# Patient Record
Sex: Male | Born: 1965 | Race: Black or African American | Hispanic: No | Marital: Single
Health system: Midwestern US, Community
[De-identification: ages and names within clinical notes are randomized; demographics above are authoritative.]

## PROBLEM LIST (undated history)

## (undated) DIAGNOSIS — G8929 Other chronic pain: Secondary | ICD-10-CM

## (undated) DIAGNOSIS — M549 Dorsalgia, unspecified: Secondary | ICD-10-CM

## (undated) DIAGNOSIS — K859 Acute pancreatitis without necrosis or infection, unspecified: Secondary | ICD-10-CM

## (undated) DIAGNOSIS — J45909 Unspecified asthma, uncomplicated: Secondary | ICD-10-CM

## (undated) DIAGNOSIS — I10 Essential (primary) hypertension: Secondary | ICD-10-CM

## (undated) DIAGNOSIS — F17210 Nicotine dependence, cigarettes, uncomplicated: Secondary | ICD-10-CM

## (undated) DIAGNOSIS — R202 Paresthesia of skin: Secondary | ICD-10-CM

## (undated) HISTORY — DX: Essential (primary) hypertension: I10

## (undated) HISTORY — PX: HERNIA REPAIR: SHX51

---

## 2013-10-04 ENCOUNTER — Emergency Department (HOSPITAL_COMMUNITY)
Admission: EM | Admit: 2013-10-04 | Discharge: 2013-10-04 | Disposition: A | Discharge status: Home / Self Care | Payer: No Typology Code available for payment source | Attending: Emergency Medicine | Admitting: Emergency Medicine

## 2013-10-04 ENCOUNTER — Encounter (HOSPITAL_COMMUNITY): Payer: Self-pay | Admitting: Emergency Medicine

## 2013-10-04 DIAGNOSIS — S161XXA Strain of muscle, fascia and tendon at neck level, initial encounter: Secondary | ICD-10-CM

## 2013-10-04 DIAGNOSIS — Y9389 Activity, other specified: Secondary | ICD-10-CM | POA: Insufficient documentation

## 2013-10-04 DIAGNOSIS — Y92410 Unspecified street and highway as the place of occurrence of the external cause: Secondary | ICD-10-CM | POA: Insufficient documentation

## 2013-10-04 DIAGNOSIS — J45909 Unspecified asthma, uncomplicated: Secondary | ICD-10-CM | POA: Insufficient documentation

## 2013-10-04 DIAGNOSIS — S139XXA Sprain of joints and ligaments of unspecified parts of neck, initial encounter: Secondary | ICD-10-CM | POA: Insufficient documentation

## 2013-10-04 DIAGNOSIS — Z79899 Other long term (current) drug therapy: Secondary | ICD-10-CM | POA: Insufficient documentation

## 2013-10-04 DIAGNOSIS — F17200 Nicotine dependence, unspecified, uncomplicated: Secondary | ICD-10-CM | POA: Insufficient documentation

## 2013-10-04 DIAGNOSIS — S0990XA Unspecified injury of head, initial encounter: Secondary | ICD-10-CM | POA: Insufficient documentation

## 2013-10-04 HISTORY — DX: Unspecified asthma, uncomplicated: J45.909

## 2013-10-04 MED ORDER — CYCLOBENZAPRINE HCL 10 MG PO TABS: 10.0000 mg | ORAL_TABLET | Freq: Three times a day (TID) | ORAL | Status: DC | PRN
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
## 2013-11-17 ENCOUNTER — Encounter (HOSPITAL_COMMUNITY): Payer: Self-pay | Admitting: Emergency Medicine

## 2013-11-17 ENCOUNTER — Emergency Department (HOSPITAL_COMMUNITY)
Admission: EM | Admit: 2013-11-17 | Discharge: 2013-11-17 | Disposition: A | Discharge status: Home / Self Care | Payer: Medicaid Other | Attending: Emergency Medicine | Admitting: Emergency Medicine

## 2013-11-17 DIAGNOSIS — J45901 Unspecified asthma with (acute) exacerbation: Secondary | ICD-10-CM | POA: Insufficient documentation

## 2013-11-17 DIAGNOSIS — Z79899 Other long term (current) drug therapy: Secondary | ICD-10-CM | POA: Insufficient documentation

## 2013-11-17 DIAGNOSIS — F17200 Nicotine dependence, unspecified, uncomplicated: Secondary | ICD-10-CM | POA: Insufficient documentation

## 2013-11-17 MED ORDER — IPRATROPIUM BROMIDE 0.02 % IN SOLN
0.5000 mg | Freq: Once | RESPIRATORY_TRACT | Status: AC
  Administered 2013-11-17: 0.5 mg via RESPIRATORY_TRACT
  Filled 2013-11-17: qty 2.5

## 2013-11-17 MED ORDER — ALBUTEROL SULFATE (5 MG/ML) 0.5% IN NEBU
5.0000 mg | INHALATION_SOLUTION | Freq: Once | RESPIRATORY_TRACT | Status: AC
  Administered 2013-11-17: 5 mg via RESPIRATORY_TRACT
  Filled 2013-11-17: qty 1

## 2013-11-17 MED ORDER — PREDNISONE 20 MG PO TABS
60.0000 mg | ORAL_TABLET | Freq: Once | ORAL | Status: AC
  Administered 2013-11-17: 60 mg via ORAL
  Filled 2013-11-17: qty 3

## 2013-11-17 MED ORDER — PREDNISONE 20 MG PO TABS: 40.0000 mg | ORAL_TABLET | Freq: Every day | ORAL | Status: DC

## 2013-11-17 NOTE — ED Notes (Signed)
Reports having asthma, having sob x 1 week, no relief with inhalers. Audible wheezing noted on arrival. Speaking in full sentences, spo2 99%.

## 2013-11-17 NOTE — ED Provider Notes (Signed)
Medical screening examination/treatment/procedure(s) were performed by non-physician practitioner and as supervising physician I was immediately available for consultation/collaboration.  EKG Interpretation   None         Robbye Dede R Darnita Woodrum, MD 11/17/13 1559 

## 2013-11-17 NOTE — ED Provider Notes (Signed)
CSN: [id number]     Arrival date & time 11/17/13  1004 History  This chart was scribed for non-physician practitioner Rhea Bleacher PA-C working with Celene Kras, MD by Leone Payor, ED Scribe. This patient was seen in room TR11C/TR11C and the patient's care was started at 1004.    Chief Complaint  Patient presents with  . Asthma    The history is provided by the patient. No language interpreter was used.    HPI Comments: Ian Patrick is a 47 y.o. male who presents to the Emergency Department complaining of asthma exacerbation that began 1 week ago. Pt states having associated constant, unchanged SOB, wheezing, and coughing. He has been using Symbicort and Combivent without relief. Pt is a daily smoker. He denies fevers, rhinorrhea, sore throat, nausea, vomiting, abdominal pain. The onset of this condition was gradual. The course is constant. Aggravating factors: none. Alleviating factors: none.      Past Medical History  Diagnosis Date  . Asthma    History reviewed. No pertinent past surgical history. History reviewed. No pertinent family history. History  Substance Use Topics  . Smoking status: Current Every Day Smoker  . Smokeless tobacco: Not on file  . Alcohol Use: Yes    Review of Systems  Constitutional: Negative for fever.  HENT: Negative for rhinorrhea and sore throat.   Eyes: Negative for redness.  Respiratory: Positive for cough, chest tightness, shortness of breath and wheezing.   Cardiovascular: Negative for chest pain.  Gastrointestinal: Negative for nausea, vomiting, abdominal pain and diarrhea.  Genitourinary: Negative for dysuria.  Musculoskeletal: Negative for myalgias.  Skin: Negative for rash.  Neurological: Negative for headaches.    Allergies  Review of patient's allergies indicates no known allergies.  Home Medications   Current Outpatient Rx  Name  Route  Sig  Dispense  Refill  . budesonide-formoterol (SYMBICORT) 80-4.5 MCG/ACT inhaler  Inhalation   Inhale 1 puff into the lungs 2 (two) times daily.          . cyclobenzaprine (FLEXERIL) 10 MG tablet   Oral   Take 1 tablet (10 mg total) by mouth 3 (three) times daily as needed for muscle spasms.   20 tablet   0   . hydrochlorothiazide (HYDRODIURIL) 25 MG tablet   Oral   Take 25 mg by mouth daily.         . Ipratropium-Albuterol (COMBIVENT) 20-100 MCG/ACT AERS respimat   Inhalation   Inhale 1 puff into the lungs every 6 (six) hours.          BP 146/86  Pulse 82  Temp(Src) 97.9 F (36.6 C) (Oral)  Resp 20  SpO2 99% Physical Exam  Nursing note and vitals reviewed. Constitutional: He appears well-developed and well-nourished.  HENT:  Head: Normocephalic and atraumatic.  Right Ear: External ear normal.  Left Ear: External ear normal.  Nose: Nose normal.  Mouth/Throat: Oropharynx is clear and moist. No oropharyngeal exudate.  Eyes: Conjunctivae are normal.  Neck: Normal range of motion. Neck supple.  Cardiovascular: Normal rate.   Pulmonary/Chest: Effort normal. No respiratory distress. He has wheezes (mild expiratory wheezes at the bases).  Pt coughing during exam.   Abdominal: He exhibits no distension.  Neurological: He is alert.  Skin: Skin is warm and dry.  Psychiatric: He has a normal mood and affect.    ED Course  Procedures   DIAGNOSTIC STUDIES: Oxygen Saturation is 97% on RA, adequate by my interpretation.    COORDINATION OF CARE: 10:35 AM  Will give breathing treatment. Discussed treatment plan with pt at bedside and pt agreed to plan.  11:22 AM Upon recheck, patient's lungs are now clear. No wheezing noted. Will discharge home with prednisone.    Labs Review Labs Reviewed - No data to display Imaging Review No results found.  EKG Interpretation   None      Vital signs reviewed and are as follows: Filed Vitals:   11/17/13 1100  BP:   Pulse:   Temp:   Resp: 19  BP 146/86  Pulse 82  Temp(Src) 97.9 F (36.6 C) (Oral)   Resp 19  SpO2 99%  Patient urged to return with worsening symptoms or other concerns. Patient verbalized understanding and agrees with plan.   He will use home combivent and symbicort as directed.    MDM   1. Asthma exacerbation    Patient with asthma exacerbation, symptoms improved with neb. Given 7 days of sx, will tx with prednisone. Vitals normal.   I personally performed the services described in this documentation, which was scribed in my presence. The recorded information has been reviewed and is accurate.   Renne Crigler, PA-C 11/17/13 1336

## 2013-11-17 NOTE — ED Notes (Signed)
C/o asthma "acting up" since Monday. States inhalers & albuterol neb not helping. Denies SOB. Resp e/u, no distress

## 2013-11-26 ENCOUNTER — Emergency Department (HOSPITAL_COMMUNITY)
Admission: EM | Admit: 2013-11-26 | Discharge: 2013-11-26 | Disposition: A | Discharge status: Home / Self Care | Payer: Medicaid Other | Attending: Emergency Medicine | Admitting: Emergency Medicine

## 2013-11-26 ENCOUNTER — Encounter (HOSPITAL_COMMUNITY): Payer: Self-pay | Admitting: Emergency Medicine

## 2013-11-26 DIAGNOSIS — R142 Eructation: Secondary | ICD-10-CM | POA: Insufficient documentation

## 2013-11-26 DIAGNOSIS — G8929 Other chronic pain: Secondary | ICD-10-CM | POA: Insufficient documentation

## 2013-11-26 DIAGNOSIS — F1010 Alcohol abuse, uncomplicated: Secondary | ICD-10-CM | POA: Insufficient documentation

## 2013-11-26 DIAGNOSIS — K852 Alcohol induced acute pancreatitis without necrosis or infection: Secondary | ICD-10-CM

## 2013-11-26 DIAGNOSIS — IMO0002 Reserved for concepts with insufficient information to code with codable children: Secondary | ICD-10-CM | POA: Insufficient documentation

## 2013-11-26 DIAGNOSIS — R110 Nausea: Secondary | ICD-10-CM | POA: Insufficient documentation

## 2013-11-26 DIAGNOSIS — Z79899 Other long term (current) drug therapy: Secondary | ICD-10-CM | POA: Insufficient documentation

## 2013-11-26 DIAGNOSIS — J45909 Unspecified asthma, uncomplicated: Secondary | ICD-10-CM | POA: Insufficient documentation

## 2013-11-26 DIAGNOSIS — R141 Gas pain: Secondary | ICD-10-CM | POA: Insufficient documentation

## 2013-11-26 DIAGNOSIS — F1410 Cocaine abuse, uncomplicated: Secondary | ICD-10-CM | POA: Insufficient documentation

## 2013-11-26 DIAGNOSIS — M549 Dorsalgia, unspecified: Secondary | ICD-10-CM | POA: Insufficient documentation

## 2013-11-26 DIAGNOSIS — F17200 Nicotine dependence, unspecified, uncomplicated: Secondary | ICD-10-CM | POA: Insufficient documentation

## 2013-11-26 DIAGNOSIS — K859 Acute pancreatitis without necrosis or infection, unspecified: Secondary | ICD-10-CM | POA: Insufficient documentation

## 2013-11-26 HISTORY — DX: Dorsalgia, unspecified: M54.9

## 2013-11-26 HISTORY — DX: Other chronic pain: G89.29

## 2013-11-26 HISTORY — DX: Acute pancreatitis without necrosis or infection, unspecified: K85.90

## 2013-11-26 LAB — URINALYSIS, ROUTINE W REFLEX MICROSCOPIC
Glucose, UA: NEGATIVE mg/dL
Hgb urine dipstick: NEGATIVE
Ketones, ur: 15 mg/dL — AB
Leukocytes, UA: NEGATIVE
Protein, ur: NEGATIVE mg/dL
Urobilinogen, UA: 0.2 mg/dL (ref 0.0–1.0)
pH: 7 (ref 5.0–8.0)

## 2013-11-26 LAB — CBC WITH DIFFERENTIAL/PLATELET
Basophils Absolute: 0 10*3/uL (ref 0.0–0.1)
Basophils Relative: 0 % (ref 0–1)
Eosinophils Absolute: 0.1 10*3/uL (ref 0.0–0.7)
Eosinophils Relative: 1 % (ref 0–5)
HCT: 44.9 % (ref 39.0–52.0)
Hemoglobin: 15.7 g/dL (ref 13.0–17.0)
Lymphocytes Relative: 29 % (ref 12–46)
Lymphs Abs: 1.7 10*3/uL (ref 0.7–4.0)
MCHC: 35 g/dL (ref 30.0–36.0)
MCV: 97.8 fL (ref 78.0–100.0)
Monocytes Absolute: 0.7 10*3/uL (ref 0.1–1.0)
Monocytes Relative: 12 % (ref 3–12)
Neutro Abs: 3.4 10*3/uL (ref 1.7–7.7)
RBC: 4.59 MIL/uL (ref 4.22–5.81)
WBC: 5.9 10*3/uL (ref 4.0–10.5)

## 2013-11-26 LAB — COMPREHENSIVE METABOLIC PANEL
ALT: 18 U/L (ref 0–53)
AST: 25 U/L (ref 0–37)
CO2: 26 mEq/L (ref 19–32)
Calcium: 9.3 mg/dL (ref 8.4–10.5)
Chloride: 96 mEq/L (ref 96–112)
Creatinine, Ser: 1.29 mg/dL (ref 0.50–1.35)
GFR calc Af Amer: 75 mL/min — ABNORMAL LOW (ref >90)
GFR calc non Af Amer: 65 mL/min — ABNORMAL LOW (ref >90)
Glucose, Bld: 111 mg/dL — ABNORMAL HIGH (ref 70–99)
Sodium: 135 mEq/L (ref 135–145)
Total Bilirubin: 0.6 mg/dL (ref 0.3–1.2)
Total Protein: 7.8 g/dL (ref 6.0–8.3)

## 2013-11-26 MED ORDER — HYDROCODONE-ACETAMINOPHEN 5-325 MG PO TABS: 1.0000 | ORAL_TABLET | Freq: Four times a day (QID) | ORAL | Status: DC | PRN

## 2013-11-26 MED ORDER — SODIUM CHLORIDE 0.9 % IV SOLN
Freq: Once | INTRAVENOUS | Status: AC
  Administered 2013-11-26: 14:00:00 via INTRAVENOUS

## 2013-11-26 MED ORDER — MORPHINE SULFATE 4 MG/ML IJ SOLN
4.0000 mg | Freq: Once | INTRAMUSCULAR | Status: AC
  Administered 2013-11-26: 4 mg via INTRAVENOUS
  Filled 2013-11-26: qty 1

## 2013-11-26 MED ORDER — ONDANSETRON HCL 4 MG/2ML IJ SOLN
4.0000 mg | Freq: Once | INTRAMUSCULAR | Status: AC
  Administered 2013-11-26: 4 mg via INTRAVENOUS
  Filled 2013-11-26: qty 2

## 2013-11-26 MED ORDER — ONDANSETRON 4 MG PO TBDP: 4.0000 mg | ORAL_TABLET | Freq: Three times a day (TID) | ORAL | Status: DC | PRN

## 2013-11-26 MED ORDER — HYDROCHLOROTHIAZIDE 25 MG PO TABS
25.0000 mg | ORAL_TABLET | Freq: Every day | ORAL | Status: DC
  Administered 2013-11-26: 25 mg via ORAL
  Filled 2013-11-26: qty 1

## 2013-11-26 MED ORDER — SODIUM CHLORIDE 0.9 % IV BOLUS (SEPSIS)
1000.0000 mL | Freq: Once | INTRAVENOUS | Status: AC
  Administered 2013-11-26: 1000 mL via INTRAVENOUS

## 2013-11-26 MED ORDER — MORPHINE SULFATE 2 MG/ML IJ SOLN
2.0000 mg | Freq: Once | INTRAMUSCULAR | Status: AC
  Administered 2013-11-26: 2 mg via INTRAVENOUS
  Filled 2013-11-26: qty 1

## 2013-11-26 NOTE — ED Provider Notes (Signed)
  Medical screening examination/treatment/procedure(s) were performed by non-physician practitioner and as supervising physician I was immediately available for consultation/collaboration.  EKG Interpretation    Date/Time:  Saturday November 26 2013 11:13:14 EST Ventricular Rate:  77 PR Interval:  116 QRS Duration: 98 QT Interval:  390 QTC Calculation: 441 R Axis:   87 Text Interpretation:  Normal sinus rhythm Normal ECG Sinus rhythm Normal ECG Confirmed by Gerhard Munch  MD ([id number]) on 11/26/2013 12:11:46 PM               Gerhard Munch, MD 11/26/13 1537

## 2013-11-26 NOTE — ED Notes (Signed)
Pt is here with LUQ abdominal pain with history of pancreatitis.  Pt reports ETOH daily.  Nauseated

## 2013-11-26 NOTE — ED Notes (Signed)
PA at bedside.

## 2013-11-26 NOTE — ED Provider Notes (Signed)
CSN: [id number]     Arrival date & time 11/26/13  1109 History   First MD Initiated Contact with Patient 11/26/13 1118     Chief Complaint  Patient presents with  . Abdominal Pain  . Nausea   (Consider location/radiation/quality/duration/timing/severity/associated sxs/prior Treatment) Patient is a 47 y.o. male presenting with abdominal pain.  Abdominal Pain Associated symptoms: nausea   Associated symptoms: no chest pain, no chills, no diarrhea, no dysuria, no fever, no hematuria, no shortness of breath and no vomiting    47 yo male with PMH significant for Pancreatitis presents with LUQ pain that started yesterday around 6 or 7 pm. Patient is a daily drinker about 5-7 drinks per day x 2 years. Drink of choice with orange juice and vodka. Patient started drinking yesterday morning and continued drinking throughout the day. Pain came on gradually and remains constant at a 7/10. Pain does not radiate. Admits to nausea that started this morning and comes in waves. Denies Vomiting, diarrhea, constipation. Denies Chest pain and shortness of breath. Denies Hematochezia, Melena, and hemoptysis.  Patient admits to cocaine use yesterday at approximately 1 pm.    Past Medical History  Diagnosis Date  . Asthma   . Pancreatitis   . Back pain, chronic    Past Surgical History  Procedure Laterality Date  . Hernia repair     No family history on file. History  Substance Use Topics  . Smoking status: Current Every Day Smoker  . Smokeless tobacco: Not on file  . Alcohol Use: Yes     Comment: daily    Review of Systems  Constitutional: Negative for fever and chills.  Respiratory: Negative for chest tightness and shortness of breath.   Cardiovascular: Negative for chest pain.  Gastrointestinal: Positive for nausea and abdominal pain. Negative for vomiting, diarrhea and blood in stool.  Genitourinary: Negative for dysuria, hematuria and difficulty urinating.  Neurological: Negative for  headaches.  All other systems reviewed and are negative.    Allergies  Review of patient's allergies indicates no known allergies.  Home Medications   Current Outpatient Rx  Name  Route  Sig  Dispense  Refill  . budesonide-formoterol (SYMBICORT) 80-4.5 MCG/ACT inhaler   Inhalation   Inhale 1 puff into the lungs 2 (two) times daily.          . hydrochlorothiazide (HYDRODIURIL) 25 MG tablet   Oral   Take 25 mg by mouth daily.         . Ipratropium-Albuterol (COMBIVENT) 20-100 MCG/ACT AERS respimat   Inhalation   Inhale 1 puff into the lungs every 6 (six) hours.         . predniSONE (DELTASONE) 20 MG tablet   Oral   Take 2 tablets (40 mg total) by mouth daily.   8 tablet   0   . HYDROcodone-acetaminophen (NORCO) 5-325 MG per tablet   Oral   Take 1-2 tablets by mouth every 6 (six) hours as needed.   15 tablet   0   . ondansetron (ZOFRAN ODT) 4 MG disintegrating tablet   Oral   Take 1 tablet (4 mg total) by mouth every 8 (eight) hours as needed for nausea or vomiting. 4mg  ODT q4 hours prn nausea/vomit   10 tablet   0    BP 132/71  Pulse 56  Temp(Src) 97.8 F (36.6 C) (Oral)  Resp 20  Wt 234 lb (106.142 kg)  SpO2 97% Physical Exam  Vitals reviewed. Constitutional: He is oriented to person, place,  and time. He appears well-developed and well-nourished. He appears distressed.  HENT:  Head: Normocephalic and atraumatic.  Eyes: Conjunctivae and EOM are normal. Pupils are equal, round, and reactive to light. No scleral icterus.  Cardiovascular: Normal rate, regular rhythm and normal heart sounds.  Exam reveals no gallop and no friction rub.   No murmur heard. Pulmonary/Chest: Effort normal and breath sounds normal. No respiratory distress. He has no wheezes. He has no rales.  Abdominal: Soft. He exhibits distension. Bowel sounds are decreased. There is tenderness in the epigastric area and left upper quadrant. There is no rigidity, no rebound, no guarding, no CVA  tenderness and no tenderness at McBurney's point. No hernia.  Musculoskeletal: Normal range of motion.  Neurological: He is alert and oriented to person, place, and time.  Skin: Skin is warm and dry. He is not diaphoretic. No cyanosis.  No jaundice or color change appreciated.   Psychiatric: He has a normal mood and affect. His behavior is normal.    ED Course  Procedures (including critical care time) Labs Review Labs Reviewed  CBC WITH DIFFERENTIAL - Abnormal; Notable for the following:    MCH 34.2 (*)    All other components within normal limits  COMPREHENSIVE METABOLIC PANEL - Abnormal; Notable for the following:    Glucose, Bld 111 (*)    GFR calc non Af Amer 65 (*)    GFR calc Af Amer 75 (*)    All other components within normal limits  LIPASE, BLOOD - Abnormal; Notable for the following:    Lipase 109 (*)    All other components within normal limits  URINALYSIS, ROUTINE W REFLEX MICROSCOPIC - Abnormal; Notable for the following:    Ketones, ur 15 (*)    All other components within normal limits  POCT I-STAT TROPONIN I   Imaging Review No results found.  EKG Interpretation    Date/Time:  Saturday November 26 2013 11:13:14 EST Ventricular Rate:  77 PR Interval:  116 QRS Duration: 98 QT Interval:  390 QTC Calculation: 441 R Axis:   87 Text Interpretation:  Normal sinus rhythm Normal ECG Sinus rhythm Normal ECG Confirmed by Gerhard Munch  MD ([id number]) on 11/26/2013 12:11:46 PM            MDM   1. Pancreatitis, alcoholic    Patient presents with epigastric pain following a full day of drinking yesterday in addition to cocaine use. PMH significant for Pancreatitis and HTN. Denies any other cardiac hx. Patient has not taken his HCTZ today. No signs of jaundice.   Patient comfortable after pain control with no nausea. Lipase elevated. Troponin Neg. EKG normal. Patient tolerates PO challenge. Plan to discharge patient home with rx for pain medication and  antiemetics. Discussed alcohol/drug cessation. Educated patient on clear liquid diet with gradual advancement and given educational handout. Provided resources for followup since patient has no PCP. Patient agrees with plan. Advised to return to ED if symptoms worsen.   Patient riding home with fionce.     Rudene Anda, PA-C 11/26/13 1521

## 2013-11-27 ENCOUNTER — Inpatient Hospital Stay (HOSPITAL_COMMUNITY)
Admission: EM | Admit: 2013-11-27 | Discharge: 2013-11-30 | DRG: 440 | Disposition: A | Discharge status: Home / Self Care | Payer: Medicaid Other | Attending: Internal Medicine | Admitting: Internal Medicine

## 2013-11-27 ENCOUNTER — Emergency Department (HOSPITAL_COMMUNITY): Payer: Medicaid Other

## 2013-11-27 ENCOUNTER — Encounter (HOSPITAL_COMMUNITY): Payer: Self-pay | Admitting: Emergency Medicine

## 2013-11-27 DIAGNOSIS — F1910 Other psychoactive substance abuse, uncomplicated: Secondary | ICD-10-CM

## 2013-11-27 DIAGNOSIS — F1410 Cocaine abuse, uncomplicated: Secondary | ICD-10-CM | POA: Diagnosis present

## 2013-11-27 DIAGNOSIS — F1010 Alcohol abuse, uncomplicated: Secondary | ICD-10-CM | POA: Diagnosis present

## 2013-11-27 DIAGNOSIS — K861 Other chronic pancreatitis: Secondary | ICD-10-CM | POA: Diagnosis present

## 2013-11-27 DIAGNOSIS — K859 Acute pancreatitis without necrosis or infection, unspecified: Principal | ICD-10-CM | POA: Diagnosis present

## 2013-11-27 DIAGNOSIS — J45909 Unspecified asthma, uncomplicated: Secondary | ICD-10-CM | POA: Diagnosis present

## 2013-11-27 DIAGNOSIS — I10 Essential (primary) hypertension: Secondary | ICD-10-CM | POA: Diagnosis present

## 2013-11-27 DIAGNOSIS — J4520 Mild intermittent asthma, uncomplicated: Secondary | ICD-10-CM

## 2013-11-27 DIAGNOSIS — F17200 Nicotine dependence, unspecified, uncomplicated: Secondary | ICD-10-CM | POA: Diagnosis present

## 2013-11-27 LAB — URINALYSIS, ROUTINE W REFLEX MICROSCOPIC
Bilirubin Urine: NEGATIVE
Hgb urine dipstick: NEGATIVE
Leukocytes, UA: NEGATIVE
Protein, ur: NEGATIVE mg/dL
Specific Gravity, Urine: 1.023 (ref 1.005–1.030)
Urobilinogen, UA: 0.2 mg/dL (ref 0.0–1.0)
pH: 5 (ref 5.0–8.0)

## 2013-11-27 LAB — POCT I-STAT TROPONIN I: Troponin i, poc: 0 ng/mL (ref 0.00–0.08)

## 2013-11-27 LAB — CBC WITH DIFFERENTIAL/PLATELET
Eosinophils Relative: 0 % (ref 0–5)
HCT: 46 % (ref 39.0–52.0)
Hemoglobin: 16.5 g/dL (ref 13.0–17.0)
Lymphocytes Relative: 7 % — ABNORMAL LOW (ref 12–46)
Lymphs Abs: 0.8 10*3/uL (ref 0.7–4.0)
MCV: 95.8 fL (ref 78.0–100.0)
Monocytes Absolute: 1 10*3/uL (ref 0.1–1.0)
Monocytes Relative: 9 % (ref 3–12)
Neutro Abs: 9.6 10*3/uL — ABNORMAL HIGH (ref 1.7–7.7)
RBC: 4.8 MIL/uL (ref 4.22–5.81)
RDW: 13.2 % (ref 11.5–15.5)
WBC: 11.4 10*3/uL — ABNORMAL HIGH (ref 4.0–10.5)

## 2013-11-27 LAB — MAGNESIUM: Magnesium: 1.9 mg/dL (ref 1.5–2.5)

## 2013-11-27 LAB — COMPREHENSIVE METABOLIC PANEL
ALT: 15 U/L (ref 0–53)
AST: 19 U/L (ref 0–37)
Albumin: 4.2 g/dL (ref 3.5–5.2)
Alkaline Phosphatase: 86 U/L (ref 39–117)
Calcium: 9.3 mg/dL (ref 8.4–10.5)
GFR calc non Af Amer: 77 mL/min — ABNORMAL LOW (ref >90)
Glucose, Bld: 135 mg/dL — ABNORMAL HIGH (ref 70–99)
Potassium: 3.6 mEq/L (ref 3.5–5.1)
Total Protein: 8.1 g/dL (ref 6.0–8.3)

## 2013-11-27 LAB — RAPID URINE DRUG SCREEN, HOSP PERFORMED
Amphetamines: NOT DETECTED
Benzodiazepines: NOT DETECTED
Opiates: POSITIVE — AB

## 2013-11-27 LAB — PHOSPHORUS: Phosphorus: 2.5 mg/dL (ref 2.3–4.6)

## 2013-11-27 LAB — GLUCOSE, CAPILLARY: Glucose-Capillary: 102 mg/dL — ABNORMAL HIGH (ref 70–99)

## 2013-11-27 MED ORDER — ONDANSETRON 4 MG PO TBDP
4.0000 mg | ORAL_TABLET | Freq: Three times a day (TID) | ORAL | Status: DC | PRN
  Filled 2013-11-27: qty 1

## 2013-11-27 MED ORDER — IOHEXOL 300 MG/ML  SOLN
25.0000 mL | INTRAMUSCULAR | Status: DC | PRN
  Administered 2013-11-27: 25 mL via ORAL

## 2013-11-27 MED ORDER — HYDROCHLOROTHIAZIDE 25 MG PO TABS
25.0000 mg | ORAL_TABLET | Freq: Every day | ORAL | Status: DC
  Administered 2013-11-27 – 2013-11-30 (×4): 25 mg via ORAL
  Filled 2013-11-27 (×4): qty 1

## 2013-11-27 MED ORDER — SODIUM CHLORIDE 0.9 % IV BOLUS (SEPSIS)
1000.0000 mL | INTRAVENOUS | Status: AC
  Administered 2013-11-27: 1000 mL via INTRAVENOUS

## 2013-11-27 MED ORDER — HYDROMORPHONE HCL PF 1 MG/ML IJ SOLN
1.0000 mg | Freq: Once | INTRAMUSCULAR | Status: AC
  Administered 2013-11-27: 1 mg via INTRAVENOUS
  Filled 2013-11-27: qty 1

## 2013-11-27 MED ORDER — ONDANSETRON HCL 4 MG/2ML IJ SOLN
4.0000 mg | Freq: Once | INTRAMUSCULAR | Status: AC
  Administered 2013-11-27: 4 mg via INTRAVENOUS
  Filled 2013-11-27: qty 2

## 2013-11-27 MED ORDER — HYDROMORPHONE HCL PF 1 MG/ML IJ SOLN
1.0000 mg | INTRAMUSCULAR | Status: DC | PRN
  Administered 2013-11-27 – 2013-11-29 (×18): 1 mg via INTRAVENOUS
  Filled 2013-11-27 (×18): qty 1

## 2013-11-27 MED ORDER — SODIUM CHLORIDE 0.9 % IV SOLN
INTRAVENOUS | Status: DC
  Administered 2013-11-27 – 2013-11-30 (×5): via INTRAVENOUS

## 2013-11-27 MED ORDER — ONDANSETRON 4 MG PO TBDP
4.0000 mg | ORAL_TABLET | Freq: Four times a day (QID) | ORAL | Status: DC | PRN
  Administered 2013-11-27 (×2): 4 mg via ORAL
  Filled 2013-11-27 (×2): qty 1

## 2013-11-27 MED ORDER — BUDESONIDE-FORMOTEROL FUMARATE 80-4.5 MCG/ACT IN AERO
1.0000 | INHALATION_SPRAY | Freq: Two times a day (BID) | RESPIRATORY_TRACT | Status: DC
  Administered 2013-11-27 – 2013-11-30 (×6): 1 via RESPIRATORY_TRACT
  Filled 2013-11-27: qty 6.9

## 2013-11-27 MED ORDER — ENOXAPARIN SODIUM 40 MG/0.4ML ~~LOC~~ SOLN
40.0000 mg | SUBCUTANEOUS | Status: DC
  Administered 2013-11-27 – 2013-11-30 (×4): 40 mg via SUBCUTANEOUS
  Filled 2013-11-27 (×4): qty 0.4

## 2013-11-27 MED ORDER — IPRATROPIUM-ALBUTEROL 20-100 MCG/ACT IN AERS
1.0000 | INHALATION_SPRAY | Freq: Four times a day (QID) | RESPIRATORY_TRACT | Status: DC
  Administered 2013-11-27 (×2): 1 via RESPIRATORY_TRACT
  Filled 2013-11-27: qty 4

## 2013-11-27 MED ORDER — IOHEXOL 300 MG/ML  SOLN
100.0000 mL | Freq: Once | INTRAMUSCULAR | Status: AC | PRN
  Administered 2013-11-27: 100 mL via INTRAVENOUS

## 2013-11-27 MED ORDER — NICOTINE 21 MG/24HR TD PT24
21.0000 mg | MEDICATED_PATCH | Freq: Every day | TRANSDERMAL | Status: DC
  Administered 2013-11-27 – 2013-11-30 (×4): 21 mg via TRANSDERMAL
  Filled 2013-11-27 (×4): qty 1

## 2013-11-27 NOTE — ED Notes (Signed)
Pt finished drinking PO contrast. CT made aware.  

## 2013-11-27 NOTE — ED Notes (Signed)
Dr. Romeo Apple back at the bedside.

## 2013-11-27 NOTE — ED Notes (Signed)
Dr. Romeo Apple at the bedside. Pt states he didn't feel like his pain was relieved when he left here yesterday.

## 2013-11-27 NOTE — ED Provider Notes (Signed)
CSN: [id number]     Arrival date & time 11/27/13  4098 History   First MD Initiated Contact with Patient 11/27/13 0421     Chief Complaint  Patient presents with  . Abdominal Pain   (Consider location/radiation/quality/duration/timing/severity/associated sxs/prior Treatment) Patient is a 47 y.o. male presenting with abdominal pain. The history is provided by the patient.  Abdominal Pain Pain location:  Epigastric Pain quality: sharp   Pain radiation: back. Pain severity:  Moderate Onset quality:  Sudden Duration:  2 days Timing:  Constant Progression:  Worsening Chronicity:  Recurrent Context: alcohol use   Relieved by:  Nothing Worsened by:  Nothing tried Ineffective treatments:  None tried Associated symptoms: no chest pain, no cough, no diarrhea, no dysuria, no fever, no hematuria, no nausea, no shortness of breath and no vomiting     Past Medical History  Diagnosis Date  . Asthma   . Pancreatitis   . Back pain, chronic    Past Surgical History  Procedure Laterality Date  . Hernia repair     No family history on file. History  Substance Use Topics  . Smoking status: Current Every Day Smoker  . Smokeless tobacco: Not on file  . Alcohol Use: Yes     Comment: daily    Review of Systems  Constitutional: Negative for fever.  HENT: Negative for drooling and rhinorrhea.   Eyes: Negative for pain.  Respiratory: Negative for cough and shortness of breath.   Cardiovascular: Negative for chest pain and leg swelling.  Gastrointestinal: Positive for abdominal pain. Negative for nausea, vomiting and diarrhea.  Genitourinary: Negative for dysuria and hematuria.  Musculoskeletal: Negative for gait problem and neck pain.  Skin: Negative for color change.  Neurological: Negative for numbness and headaches.  Hematological: Negative for adenopathy.  Psychiatric/Behavioral: Negative for behavioral problems.  All other systems reviewed and are negative.    Allergies   Review of patient's allergies indicates no known allergies.  Home Medications   Current Outpatient Rx  Name  Route  Sig  Dispense  Refill  . budesonide-formoterol (SYMBICORT) 80-4.5 MCG/ACT inhaler   Inhalation   Inhale 1 puff into the lungs 2 (two) times daily.          . hydrochlorothiazide (HYDRODIURIL) 25 MG tablet   Oral   Take 25 mg by mouth daily.         Marland Kitchen HYDROcodone-acetaminophen (NORCO) 5-325 MG per tablet   Oral   Take 1-2 tablets by mouth every 6 (six) hours as needed.   15 tablet   0   . Ipratropium-Albuterol (COMBIVENT) 20-100 MCG/ACT AERS respimat   Inhalation   Inhale 1 puff into the lungs every 6 (six) hours.         . ondansetron (ZOFRAN ODT) 4 MG disintegrating tablet   Oral   Take 1 tablet (4 mg total) by mouth every 8 (eight) hours as needed for nausea or vomiting. 4mg  ODT q4 hours prn nausea/vomit   10 tablet   0    BP 141/74  Pulse 66  Temp(Src) 97.9 F (36.6 C) (Oral)  Resp 22  SpO2 100% Physical Exam  Nursing note and vitals reviewed. Constitutional: He is oriented to person, place, and time. He appears well-developed and well-nourished.  HENT:  Head: Normocephalic and atraumatic.  Right Ear: External ear normal.  Left Ear: External ear normal.  Nose: Nose normal.  Mouth/Throat: Oropharynx is clear and moist. No oropharyngeal exudate.  Eyes: Conjunctivae and EOM are normal. Pupils are equal,  round, and reactive to light.  Neck: Normal range of motion. Neck supple.  Cardiovascular: Normal rate, regular rhythm, normal heart sounds and intact distal pulses.  Exam reveals no gallop and no friction rub.   No murmur heard. Pulmonary/Chest: Effort normal and breath sounds normal. No respiratory distress. He has no wheezes.  Abdominal: Soft. Bowel sounds are normal. He exhibits no distension. There is tenderness (moderate ttp of epig area). There is no rebound and no guarding.  Musculoskeletal: Normal range of motion. He exhibits no edema  and no tenderness.  Neurological: He is alert and oriented to person, place, and time.  Skin: Skin is warm and dry.  Psychiatric: He has a normal mood and affect. His behavior is normal.    ED Course  Procedures (including critical care time) Labs Review Labs Reviewed  CBC WITH DIFFERENTIAL - Abnormal; Notable for the following:    WBC 11.4 (*)    MCH 34.4 (*)    Neutrophils Relative % 84 (*)    Neutro Abs 9.6 (*)    Lymphocytes Relative 7 (*)    All other components within normal limits  COMPREHENSIVE METABOLIC PANEL - Abnormal; Notable for the following:    Sodium 129 (*)    Chloride 90 (*)    Glucose, Bld 135 (*)    GFR calc non Af Amer 77 (*)    GFR calc Af Amer 89 (*)    All other components within normal limits  LIPASE, BLOOD - Abnormal; Notable for the following:    Lipase 1196 (*)    All other components within normal limits  URINALYSIS, ROUTINE W REFLEX MICROSCOPIC - Abnormal; Notable for the following:    APPearance CLOUDY (*)    Ketones, ur 40 (*)    All other components within normal limits  URINE RAPID DRUG SCREEN (HOSP PERFORMED) - Abnormal; Notable for the following:    Opiates POSITIVE (*)    Cocaine POSITIVE (*)    All other components within normal limits  GLUCOSE, CAPILLARY - Abnormal; Notable for the following:    Glucose-Capillary 118 (*)    All other components within normal limits  GLUCOSE, CAPILLARY - Abnormal; Notable for the following:    Glucose-Capillary 102 (*)    All other components within normal limits  GLUCOSE, CAPILLARY - Abnormal; Notable for the following:    Glucose-Capillary 113 (*)    All other components within normal limits  GLUCOSE, CAPILLARY - Abnormal; Notable for the following:    Glucose-Capillary 150 (*)    All other components within normal limits  GLUCOSE, CAPILLARY - Abnormal; Notable for the following:    Glucose-Capillary 143 (*)    All other components within normal limits  MAGNESIUM  PHOSPHORUS  COMPREHENSIVE  METABOLIC PANEL  CBC  POCT I-STAT TROPONIN I   Imaging Review Ct Abdomen Pelvis W Contrast  11/27/2013   CLINICAL DATA:  Persistent mid and upper abdominal pain with nausea and vomiting since yesterday. Elevated lipase. Patient was here yesterday and was diagnosed with alcoholic pancreatitis.  EXAM: CT ABDOMEN AND PELVIS WITH CONTRAST  TECHNIQUE: Multidetector CT imaging of the abdomen and pelvis was performed using the standard protocol following bolus administration of intravenous contrast.  CONTRAST:  OMNIPAQUE IOHEXOL 300 MG/ML  SOLN  COMPARISON:  None.  FINDINGS: The lung bases are clear.  There is diffuse infiltration around the pancreas and in the upper epigastric region with peripancreatic edema. Edema extends into the left anterior pericolic gutter and left anterior pararenal space.  Focal extension to the posterior spleen. There is normal enhancement of the pancreatic tissue suggesting no evidence of necrosis. Pancreatic duct is not dilated. No loculated fluid collections to suggest pseudocyst.  The liver, spleen, gallbladder, adrenal glands, abdominal aorta, inferior vena cava, and retroperitoneal lymph nodes are unremarkable. Small parenchymal cysts in the kidneys without evidence of solid mass or hydronephrosis. The stomach and small bowel are not abnormally distended. The colon is decompressed but appears to demonstrate diffuse colonic wall thickening. This could be due to under distention but suggest possible colitis. No free air or free fluid in the abdomen.  Pelvis: The appendix is normal. Prostate gland is not enlarged. Bladder wall is not thickened. No free or loculated pelvic fluid collections. No evidence of diverticulitis. The normal alignment of the lumbar spine. No destructive bone lesions.  IMPRESSION: Diffuse peripancreatic edema and infiltration consistent with acute pancreatitis. No evidence of pancreatic necrosis, abscess, or pseudocyst. Nonspecific appearance of the colon  suggesting diffuse wall thickening. This may be due to under distention but colitis is not excluded.   Electronically Signed   By: Burman Nieves M.D.   On: 11/27/2013 06:49    EKG Interpretation    Date/Time:  Sunday November 27 2013 03:50:07 EST Ventricular Rate:  69 PR Interval:  141 QRS Duration: 100 QT Interval:  415 QTC Calculation: 445 R Axis:   85 Text Interpretation:  Sinus rhythm No significant change since last tracing Confirmed by Yang Rack  MD, Louvina Cleary ([id number]) on 11/27/2013 5:51:14 AM            MDM   1. Pancreatitis   2. Acute pancreatitis   3. Asthma, chronic   4. Excessive drinking alcohol   5. Hypertension    4:47 AM 47 y.o. male with a history of alcohol abuse who presents with epigastric pain and vomiting. The patient was seen here yesterday for similar symptoms. He had a mildly elevated lipase at that time. He is afebrile and vital signs are unremarkable here today. He suspected to have alcoholic pancreatitis as he has been drinking 5-6 mixed drinks daily. He states that he did not drink alcohol yesterday but continued to have pain and vomiting. Will get screening labwork, IV fluid, pain control, and CT scan.  Lipase has inc from 109 to 1196. CT c/w acute pancreatitis. Will admit to unassigned (internal medicine teaching service) for pain control.     Junius Argyle, MD 11/28/13 [phone]

## 2013-11-27 NOTE — ED Notes (Signed)
Pt returned from CT °

## 2013-11-27 NOTE — ED Notes (Signed)
Pt. reports persistent mid /upper abdominal pain with nausea and vomitting onset yersterday , seen here yesterday with the same complaints - blood tests / urinalysis done discharged home diagnosed with alcohiolic pancreatitis.

## 2013-11-27 NOTE — H&P (Signed)
Date: 11/27/2013               Patient Name:  Ian Patrick MRN: [id number]  DOB: [birth year] Age / Sex: 47 y.o., male   PCP: No Pcp Per Patient         Medical Service: Internal Medicine Teaching Service         Attending Physician: Dr. Kem Kays    First Contact: Dr. Mariea Clonts Pager: [phone]  Second Contact: Dr. Zada Girt Pager: [phone]       After Hours (After 5p/  First Contact Pager: [phone]  weekends / holidays): Second Contact Pager: [phone]   Chief Complaint: abdominal pain  History of Present Illness:  Mr. Ian Patrick is a 47 year old man with history of chronic pancreatitis, alcohol and cocaine abuse, HTN, asthma who presents with epigastric abdominal pain x 2 days.  Patient was seen in ED yesterday with same complaint, returned today due to intractable pain.   Patient states that Friday (11/28), he drank several ("3 or more") vodka-orange juice drinks and soon after began having epigastric abdominal pain.  Yesterday in ED, he states he was given Percocet which eased the pain enough so he could sleep, but pain returned this morning and worse.  He has also had chills and felt somewhat nauseous, vomited once (clear, NBNB) this morning.  He has reportedly had two previous episodes of pancreatitis (last one "several years ago" and required 3 day hospitalization), both following bouts of drinking.   Denies fever, chest pain, change in bowel or bladder habits. Last BM yesterday, no blood.  He smokes 1/2 PPD (x 20 years), drinks 1-2 mixed drinks daily, denies illicits.  He moved to [place] 2 months ago from [place] and lives here with his Ian Patrick, works as a Psychologist, occupational.   In ED, patient had CT abdomen which showed diffuse peripancreatic edema and infiltration consistent with acute pancreatitis; no evidence of pancreatic necrosis, abscess, or psuedocyst.  His lipase is also elevated to 1196 from 109 on 11/29 (yesterday).  He received pain control, IVFs and was admitted to IMTS.   Meds: Current  Facility-Administered Medications  Medication Dose Route Frequency Provider Last Rate Last Dose  . iohexol (OMNIPAQUE) 300 MG/ML solution 25 mL  25 mL Oral PRN Medication Radiologist, MD   25 mL at 11/27/13 0511   Current Outpatient Prescriptions  Medication Sig Dispense Refill  . budesonide-formoterol (SYMBICORT) 80-4.5 MCG/ACT inhaler Inhale 1 puff into the lungs 2 (two) times daily.       . hydrochlorothiazide (HYDRODIURIL) 25 MG tablet Take 25 mg by mouth daily.      Marland Kitchen HYDROcodone-acetaminophen (NORCO) 5-325 MG per tablet Take 1-2 tablets by mouth every 6 (six) hours as needed.  15 tablet  0  . Ipratropium-Albuterol (COMBIVENT) 20-100 MCG/ACT AERS respimat Inhale 1 puff into the lungs every 6 (six) hours.      . ondansetron (ZOFRAN ODT) 4 MG disintegrating tablet Take 1 tablet (4 mg total) by mouth every 8 (eight) hours as needed for nausea or vomiting. 4mg  ODT q4 hours prn nausea/vomit  10 tablet  0    Allergies: Allergies as of 11/27/2013  . (No Known Allergies)   Past Medical History  Diagnosis Date  . Asthma   . Pancreatitis   . Back pain, chronic    Past Surgical History  Procedure Laterality Date  . Hernia repair     No family history on file. History   Social History  . Marital Status: Single  Spouse Name: N/A    Number of Children: N/A  . Years of Education: N/A   Occupational History  . Not on file.   Social History Main Topics  . Smoking status: Current Every Day Smoker  . Smokeless tobacco: Not on file  . Alcohol Use: Yes     Comment: daily  . Drug Use: Not on file  . Sexual Activity: Not on file   Other Topics Concern  . Not on file   Social History Narrative  . No narrative on file    Review of Systems: Review of Systems  Constitutional: Positive for chills and malaise/fatigue. Negative for fever.  Eyes: Negative for blurred vision.  Respiratory: Negative for cough and shortness of breath.   Cardiovascular: Negative for chest pain,  palpitations and leg swelling.  Gastrointestinal: Positive for nausea, vomiting and abdominal pain. Negative for diarrhea, constipation and blood in stool.  Genitourinary: Negative for dysuria.  Musculoskeletal: Negative for falls.  Neurological: Positive for weakness. Negative for dizziness, loss of consciousness and headaches.    Physical Exam: Blood pressure 155/78, pulse 71, temperature 97.9 F (36.6 C), temperature source Oral, resp. rate 14, SpO2 100.00%. General: alert, cooperative but in distress 2/2 pain, asking for pain meds HEENT: NCAT, vision grossly intact, oropharynx clear and non-erythematous  Neck: supple, no lymphadenopathy Lungs: clear to ascultation bilaterally, normal work of respiration, no wheezes, rales, ronchi Heart: regular rate and rhythm, no murmurs, gallops, or rubs Abdomen: mildly TTP in epigastric area, ?mildly distended, soft, +bowel sounds Extremities: 2+ DP/PT pulses bilaterally, no cyanosis, clubbing, or edema Neurologic: alert & oriented X3, cranial nerves II-XII intact, strength grossly intact, sensation intact to light touch   Lab results: Basic Metabolic Panel:  Recent Labs  16/10/96 1136 11/27/13 0345  NA 135 129*  K 4.2 3.6  CL 96 90*  CO2 26 24  GLUCOSE 111* 135*  BUN 12 7  CREATININE 1.29 1.12  CALCIUM 9.3 9.3   Liver Function Tests:  Recent Labs  11/26/13 1136 11/27/13 0345  AST 25 19  ALT 18 15  ALKPHOS 82 86  BILITOT 0.6 0.8  PROT 7.8 8.1  ALBUMIN 4.2 4.2    Recent Labs  11/26/13 1136 11/27/13 0345  LIPASE 109* 1196*   CBC:  Recent Labs  11/26/13 1136 11/27/13 0345  WBC 5.9 11.4*  NEUTROABS 3.4 9.6*  HGB 15.7 16.5  HCT 44.9 46.0  MCV 97.8 95.8  PLT 241 211   Urinalysis:  Recent Labs  11/26/13 1200 11/27/13 0519  COLORURINE YELLOW YELLOW  LABSPEC 1.023 1.023  PHURINE 7.0 5.0  GLUCOSEU NEGATIVE NEGATIVE  HGBUR NEGATIVE NEGATIVE  BILIRUBINUR NEGATIVE NEGATIVE  KETONESUR 15* 40*  PROTEINUR  NEGATIVE NEGATIVE  UROBILINOGEN 0.2 0.2  NITRITE NEGATIVE NEGATIVE  LEUKOCYTESUR NEGATIVE NEGATIVE    Imaging results:  Ct Abdomen Pelvis W Contrast  11/27/2013   CLINICAL DATA:  Persistent mid and upper abdominal pain with nausea and vomiting since yesterday. Elevated lipase. Patient was here yesterday and was diagnosed with alcoholic pancreatitis.  EXAM: CT ABDOMEN AND PELVIS WITH CONTRAST  TECHNIQUE: Multidetector CT imaging of the abdomen and pelvis was performed using the standard protocol following bolus administration of intravenous contrast.  CONTRAST:  OMNIPAQUE IOHEXOL 300 MG/ML  SOLN  COMPARISON:  None.  FINDINGS: The lung bases are clear.  There is diffuse infiltration around the pancreas and in the upper epigastric region with peripancreatic edema. Edema extends into the left anterior pericolic gutter and left anterior pararenal space.  Focal extension to the posterior spleen. There is normal enhancement of the pancreatic tissue suggesting no evidence of necrosis. Pancreatic duct is not dilated. No loculated fluid collections to suggest pseudocyst.  The liver, spleen, gallbladder, adrenal glands, abdominal aorta, inferior vena cava, and retroperitoneal lymph nodes are unremarkable. Small parenchymal cysts in the kidneys without evidence of solid mass or hydronephrosis. The stomach and small bowel are not abnormally distended. The colon is decompressed but appears to demonstrate diffuse colonic wall thickening. This could be due to under distention but suggest possible colitis. No free air or free fluid in the abdomen.  Pelvis: The appendix is normal. Prostate gland is not enlarged. Bladder wall is not thickened. No free or loculated pelvic fluid collections. No evidence of diverticulitis. The normal alignment of the lumbar spine. No destructive bone lesions.  IMPRESSION: Diffuse peripancreatic edema and infiltration consistent with acute pancreatitis. No evidence of pancreatic necrosis,  abscess, or pseudocyst. Nonspecific appearance of the colon suggesting diffuse wall thickening. This may be due to under distention but colitis is not excluded.   Electronically Signed   By: Burman Nieves M.D.   On: 11/27/2013 06:49    Other results: EKG: sinus rhythm, normal axis, normal intervals, no ST or T wave changes  Assessment & Plan by Problem: #Acute on chronic pancreatitis- very likely due to recent alcohol use, especially considering this has been trigger in past.  BISAP score of 0 (<1% mortality risk).  Pain controlled with dilaudid IV. CT abdomen showed diffuse peripancreatic edema and infiltration consistent with acute pancreatitis; no evidence of pancreatic necrosis, abscess, or psuedocyst.  Lipase elevated to 1196 from 109 on 11/29 (yesterday), WBC 11.4.  LFTS within normal limits, Mg and Phos within normal limits.  Troponin x 1 negative (POC trop yesterday negative as well). -admit to IMTS -NPO -NS at 75 cc/hr while NPO -CBGs q4h while NPO -dilaudid 1 mg IV q2h prn pain  -Zofran 4mg  ODT q4h prn nausea -CMP in AM  #EtOH, cocaine, tobacco abuse- UDS + for cocaine, opiates -CiWA protocol given EtOH use 48 hours ago -consult to social work  -tobacco cessation counseling -nicotine patch  #HTN- stable, slightly elevated. HCTZ class 3 drug for drug-induced pancreatitis thus will continue while inpatient.  -continue HCTZ 25 mg daily -continue to monitor  #Asthma- continue home inhalers (Symbicort, Combivent) while inpatient.  #DVT PPX- lovenox  #Code status- Full code  Dispo: Disposition is deferred at this time, awaiting improvement of current medical problems. Anticipated discharge in approximately 1-3 day(s).   The patient does not have a current PCP (No Pcp Per Patient) and does need an [facility] hospital follow-up appointment after discharge.   Signed: Rocco Serene, MD 11/27/2013, 7:31 AM

## 2013-11-28 LAB — COMPREHENSIVE METABOLIC PANEL
ALT: 10 U/L (ref 0–53)
AST: 14 U/L (ref 0–37)
Alkaline Phosphatase: 78 U/L (ref 39–117)
CO2: 32 mEq/L (ref 19–32)
Calcium: 9 mg/dL (ref 8.4–10.5)
Creatinine, Ser: 1.2 mg/dL (ref 0.50–1.35)
GFR calc Af Amer: 82 mL/min — ABNORMAL LOW (ref >90)
Glucose, Bld: 102 mg/dL — ABNORMAL HIGH (ref 70–99)
Potassium: 3.7 mEq/L (ref 3.5–5.1)
Sodium: 131 mEq/L — ABNORMAL LOW (ref 135–145)
Total Bilirubin: 0.9 mg/dL (ref 0.3–1.2)
Total Protein: 7.6 g/dL (ref 6.0–8.3)

## 2013-11-28 LAB — GLUCOSE, CAPILLARY
Glucose-Capillary: 122 mg/dL — ABNORMAL HIGH (ref 70–99)
Glucose-Capillary: 132 mg/dL — ABNORMAL HIGH (ref 70–99)
Glucose-Capillary: 143 mg/dL — ABNORMAL HIGH (ref 70–99)
Glucose-Capillary: 149 mg/dL — ABNORMAL HIGH (ref 70–99)

## 2013-11-28 LAB — CBC
Hemoglobin: 16 g/dL (ref 13.0–17.0)
MCH: 34.2 pg — ABNORMAL HIGH (ref 26.0–34.0)
MCHC: 35 g/dL (ref 30.0–36.0)
MCV: 97.6 fL (ref 78.0–100.0)
RBC: 4.68 MIL/uL (ref 4.22–5.81)

## 2013-11-28 MED ORDER — IPRATROPIUM-ALBUTEROL 20-100 MCG/ACT IN AERS
1.0000 | INHALATION_SPRAY | Freq: Four times a day (QID) | RESPIRATORY_TRACT | Status: DC
  Administered 2013-11-28 – 2013-11-30 (×9): 1 via RESPIRATORY_TRACT

## 2013-11-28 NOTE — H&P (Signed)
  Date: 11/28/2013  Patient name: Ian Patrick  Medical record number: [id number]  Date of birth: Sep 21, 1966   I have seen and evaluated Ian Patrick and discussed their care with the Residency Team.   Assessment and Plan: I have seen and evaluated the patient as outlined above. I agree with the formulated Assessment and Plan as detailed in the residents' admission note, with the following changes:   1. Acute on chronic pancreatitis - this is his third episode, all requiring hospitalization. The trigger seems to be ETOH. His CT was c/w pancreatitis but without chronic changes, pseudocyst, or abscess. His exam shows + BS, mild distension, and tenderness to palp epigastrically. He is tolerating ice chips. Will cont NPO, IVF, IV opioids, and reassess in AM.  2. HTN - well controlled. Cont HCTZ  3. Asthma - cont home meds. Lungs are clear today with good air movement.  Likely home in 2-3 days, depending on clinical response.   Burns Spain, MD 12/1/20142:06 PM

## 2013-11-28 NOTE — Progress Notes (Signed)
Subjective: Patient reports that he is feeling "ok" this morning. He endorses nausea and one episode of emesis last night. This has improved with Zofran. His pain is currently 7/10, but is well controlled when taking prescribed pain medication. He was able to sleep last night when pain was controlled. He denies chest pain, palpitations, back pain or SOB. Bowel movements are normal.    Objective: Vital signs in last 24 hours: Filed Vitals:   11/27/13 2045 11/27/13 2141 11/28/13 0538 11/28/13 1007  BP:  131/79 130/70   Pulse: 86 87 77   Temp:  99 F (37.2 C) 98.3 F (36.8 C)   TempSrc:  Oral    Resp: 18 18 18    Height:      Weight:      SpO2: 97% 94% 100% 98%   Weight change:   Intake/Output Summary (Last 24 hours) at 11/28/13 1052 Last data filed at 11/28/13 0600  Gross per 24 hour  Intake   1545 ml  Output      0 ml  Net   1545 ml   Physical Exam:  General: No acute distress, resting in bed  HEENT: vision grossly intact, oropharynx clear and non-erythematous  Lungs: clear to ascultation bilaterallly, no crackles, wheezing or rhonci. No increased work of breathing noted  Heart: regular rate and rhythm, no murmurs, rubs or gallops  Abdomen:  Diffuse tenderness to palpation that is most tender in mid-epigastric region. Appears mildly distended. No rebound tenderness or guarding. Bowel sounds present  Extremities: 2+ dorsalis pedis and posterior tibial pulses bilaterally, no cyanosis, clubbing, or edema Neurologic: alert & grossly oriented X3, strength grossly intact, sensation intact to light touch  Lab Results   Ref. Range 11/28/2013 05:35  Sodium Latest Range: 135-145 mEq/L 131 (L)  Potassium Latest Range: 3.5-5.1 mEq/L 3.7  Chloride Latest Range: 96-112 mEq/L 91 (L)  CO2 Latest Range: 19-32 mEq/L 32  BUN Latest Range: 6-23 mg/dL 5 (L)  Creatinine Latest Range: 0.50-1.35 mg/dL 4.78  Calcium Latest Range: 8.4-10.5 mg/dL 9.0  GFR calc non Af Amer Latest Range: >90 mL/min 71  (L)  GFR calc Af Amer Latest Range: >90 mL/min 82 (L)  Glucose Latest Range: 70-99 mg/dL 295 (H)  Alkaline Phosphatase Latest Range: 39-117 U/L 78  Albumin Latest Range: 3.5-5.2 g/dL 3.7  AST Latest Range: 0-37 U/L 14  ALT Latest Range: 0-53 U/L 10  Total Protein Latest Range: 6.0-8.3 g/dL 7.6  Total Bilirubin Latest Range: 0.3-1.2 mg/dL 0.9  WBC Latest Range: 4.0-10.5 K/uL 10.2  RBC Latest Range: 4.22-5.81 MIL/uL 4.68  Hemoglobin Latest Range: 13.0-17.0 g/dL 62.1  HCT Latest Range: 39.0-52.0 % 45.7  MCV Latest Range: 78.0-100.0 fL 97.6  MCH Latest Range: 26.0-34.0 pg 34.2 (H)  MCHC Latest Range: 30.0-36.0 g/dL 30.8  RDW Latest Range: 11.5-15.5 % 13.3  Platelets Latest Range: 150-400 K/uL 215  Lipase (11/29): 1196 U/L   Studies/Results: Ct Abdomen Pelvis W Contrast  11/27/2013   CLINICAL DATA:  Persistent mid and upper abdominal pain with nausea and vomiting since yesterday. Elevated lipase. Patient was here yesterday and was diagnosed with alcoholic pancreatitis.  EXAM: CT ABDOMEN AND PELVIS WITH CONTRAST  TECHNIQUE: Multidetector CT imaging of the abdomen and pelvis was performed using the standard protocol following bolus administration of intravenous contrast.  CONTRAST:  OMNIPAQUE IOHEXOL 300 MG/ML  SOLN  COMPARISON:  None.  FINDINGS: The lung bases are clear.  There is diffuse infiltration around the pancreas and in the upper epigastric region with  peripancreatic edema. Edema extends into the left anterior pericolic gutter and left anterior pararenal space. Focal extension to the posterior spleen. There is normal enhancement of the pancreatic tissue suggesting no evidence of necrosis. Pancreatic duct is not dilated. No loculated fluid collections to suggest pseudocyst.  The liver, spleen, gallbladder, adrenal glands, abdominal aorta, inferior vena cava, and retroperitoneal lymph nodes are unremarkable. Small parenchymal cysts in the kidneys without evidence of solid mass or  hydronephrosis. The stomach and small bowel are not abnormally distended. The colon is decompressed but appears to demonstrate diffuse colonic wall thickening. This could be due to under distention but suggest possible colitis. No free air or free fluid in the abdomen.  Pelvis: The appendix is normal. Prostate gland is not enlarged. Bladder wall is not thickened. No free or loculated pelvic fluid collections. No evidence of diverticulitis. The normal alignment of the lumbar spine. No destructive bone lesions.  IMPRESSION: Diffuse peripancreatic edema and infiltration consistent with acute pancreatitis. No evidence of pancreatic necrosis, abscess, or pseudocyst. Nonspecific appearance of the colon suggesting diffuse wall thickening. This may be due to under distention but colitis is not excluded.   Electronically Signed   By: Burman Nieves M.D.   On: 11/27/2013 06:49   Medications: I have reviewed the patient's current medications. Scheduled Meds: . budesonide-formoterol  1 puff Inhalation BID  . enoxaparin (LOVENOX) injection  40 mg Subcutaneous Q24H  . hydrochlorothiazide  25 mg Oral Daily  . Ipratropium-Albuterol  1 puff Inhalation Q6H  . nicotine  21 mg Transdermal Daily   Continuous Infusions: . sodium chloride 75 mL/hr at 11/28/13 0742   PRN Meds:.HYDROmorphone (DILAUDID) injection, ondansetron  Assessment/Plan:  1) Acute on chronic pancreatitis- This is likely due to recent alcohol use, especially considering this has been trigger in past. BISAP score of 0 (<1% mortality risk). Pain currently controlled with dilaudid IV. CT abdomen showed diffuse peripancreatic edema and infiltration consistent with acute pancreatitis; no evidence of pancreatic necrosis, abscess, or psuedocyst. Lipase elevated to 1196 from 109 on 11/29,  WBC decreased to 10.2 today  (previously 11.4). LFTS remain within normal limits. Will continue to monitor patient, provide pain relief and IV fluids.   -Continue NPO    -NS at 75 cc/hr while NPO  -CBGs q4h while NPO  - Continue Dilaudid 1 mg IV q2h prn pain  - Continue Zofran 4mg  ODT q4h prn nausea  - Repeat CMP in AM   2) EtOH, cocaine, tobacco abuse- UDS on admission was positive for cocaine and opiates.  - Continue CiWA protocol given EtOH use  - Consult to social work  -Tobacco cessation counseling  -Continue nicotine patch   3) HTN- Currently stable and asymptomatic. HCTZ class 3 drug for drug-induced pancreatitis thus will continue while inpatient.  -continue HCTZ 25 mg daily  -continue to monitor   4) Asthma- continue home inhalers (Symbicort, Combivent) while inpatient.   DVT PPX- lovenox   Code status- Full code   Dispo: Disposition is deferred at this time, awaiting improvement of current medical problems. Anticipated discharge in approximately 1-3 day(s).   The patient does not have a current PCP (No Pcp Per Patient) and does need an [facility] hospital follow-up appointment after discharge.    This is a Psychologist, occupational Note.      LOS: 1 day   Floy Sabina, Med Student 11/28/2013, 10:52 AM    I have seen the patient and reviewed the daily progress note by Floy Sabina, MS and  discussed the care of the patient with them.  See below for documentation of my findings, assessment, and plans.  Kennis Carina, MD 11/28/2013, 4:08 PM

## 2013-11-28 NOTE — Progress Notes (Signed)
Subjective: Pt says he is having some abdominal pain, present when the dilaudid wears off. Pt is still NPO. Lying in bed comfortably. No nausea or vomiting.   Objective: Vital signs in last 24 hours: Filed Vitals:   11/27/13 2141 11/28/13 0538 11/28/13 1007 11/28/13 1459  BP: 131/79 130/70  141/72  Pulse: 87 77  87  Temp: 99 F (37.2 C) 98.3 F (36.8 C)  98.2 F (36.8 C)  TempSrc: Oral   Oral  Resp: 18 18  18   Height:      Weight:      SpO2: 94% 100% 98% 98%   Weight change:   Intake/Output Summary (Last 24 hours) at 11/28/13 1534 Last data filed at 11/28/13 0600  Gross per 24 hour  Intake   1545 ml  Output      0 ml  Net   1545 ml   General appearance: alert, cooperative and appears stated age Head: Normocephalic, without obvious abnormality, atraumatic Lungs: clear to auscultation bilaterally Heart: regular rate and rhythm and S1, S2 normal Abdomen: soft, tenderness in epigastric region; bowel sounds reduced; no masses,  no organomegaly Pulses: 2+ and symmetric  Lab Results: Basic Metabolic Panel:  Recent Labs Lab 11/26/13 1136 11/27/13 0345 11/28/13 0535  NA 135 129* 131*  K 4.2 3.6 3.7  CL 96 90* 91*  CO2 26 24 32  GLUCOSE 111* 135* 102*  BUN 12 7 5*  CREATININE 1.29 1.12 1.20  CALCIUM 9.3 9.3 9.0  MG  --  1.9  --   PHOS  --  2.5  --    Liver Function Tests:  Recent Labs Lab 11/27/13 0345 11/28/13 0535  AST 19 14  ALT 15 10  ALKPHOS 86 78  BILITOT 0.8 0.9  PROT 8.1 7.6  ALBUMIN 4.2 3.7    Recent Labs Lab 11/26/13 1136 11/27/13 0345  LIPASE 109* 1196*   No results found for this basename: AMMONIA,  in the last 168 hours CBC:  Recent Labs Lab 11/26/13 1136 11/27/13 0345 11/28/13 0535  WBC 5.9 11.4* 10.2  NEUTROABS 3.4 9.6*  --   HGB 15.7 16.5 16.0  HCT 44.9 46.0 45.7  MCV 97.8 95.8 97.6  PLT 241 211 215   CBG:  Recent Labs Lab 11/27/13 1145 11/27/13 1628 11/27/13 2118 11/28/13 0003 11/28/13 0414 11/28/13 1249    GLUCAP 118* 102* 113* 150* 143* 132*   Urine Drug Screen: Drugs of Abuse     Component Value Date/Time   LABOPIA POSITIVE* 11/27/2013 0519   COCAINSCRNUR POSITIVE* 11/27/2013 0519   LABBENZ NONE DETECTED 11/27/2013 0519   AMPHETMU NONE DETECTED 11/27/2013 0519   THCU NONE DETECTED 11/27/2013 0519   LABBARB NONE DETECTED 11/27/2013 0519    Alcohol Level: No results found for this basename: ETH,  in the last 168 hours Urinalysis:  Recent Labs Lab 11/26/13 1200 11/27/13 0519  COLORURINE YELLOW YELLOW  LABSPEC 1.023 1.023  PHURINE 7.0 5.0  GLUCOSEU NEGATIVE NEGATIVE  HGBUR NEGATIVE NEGATIVE  BILIRUBINUR NEGATIVE NEGATIVE  KETONESUR 15* 40*  PROTEINUR NEGATIVE NEGATIVE  UROBILINOGEN 0.2 0.2  NITRITE NEGATIVE NEGATIVE  LEUKOCYTESUR NEGATIVE NEGATIVE    Micro Results: No results found for this or any previous visit (from the past 240 hour(s)). Studies/Results: Ct Abdomen Pelvis W Contrast  11/27/2013   CLINICAL DATA:  Persistent mid and upper abdominal pain with nausea and vomiting since yesterday. Elevated lipase. Patient was here yesterday and was diagnosed with alcoholic pancreatitis.  EXAM: CT ABDOMEN AND PELVIS  WITH CONTRAST  TECHNIQUE: Multidetector CT imaging of the abdomen and pelvis was performed using the standard protocol following bolus administration of intravenous contrast.  CONTRAST:  OMNIPAQUE IOHEXOL 300 MG/ML  SOLN  COMPARISON:  None.  FINDINGS: The lung bases are clear.  There is diffuse infiltration around the pancreas and in the upper epigastric region with peripancreatic edema. Edema extends into the left anterior pericolic gutter and left anterior pararenal space. Focal extension to the posterior spleen. There is normal enhancement of the pancreatic tissue suggesting no evidence of necrosis. Pancreatic duct is not dilated. No loculated fluid collections to suggest pseudocyst.  The liver, spleen, gallbladder, adrenal glands, abdominal aorta, inferior vena  cava, and retroperitoneal lymph nodes are unremarkable. Small parenchymal cysts in the kidneys without evidence of solid mass or hydronephrosis. The stomach and small bowel are not abnormally distended. The colon is decompressed but appears to demonstrate diffuse colonic wall thickening. This could be due to under distention but suggest possible colitis. No free air or free fluid in the abdomen.  Pelvis: The appendix is normal. Prostate gland is not enlarged. Bladder wall is not thickened. No free or loculated pelvic fluid collections. No evidence of diverticulitis. The normal alignment of the lumbar spine. No destructive bone lesions.  IMPRESSION: Diffuse peripancreatic edema and infiltration consistent with acute pancreatitis. No evidence of pancreatic necrosis, abscess, or pseudocyst. Nonspecific appearance of the colon suggesting diffuse wall thickening. This may be due to under distention but colitis is not excluded.   Electronically Signed   By: Burman Nieves M.D.   On: 11/27/2013 06:49   Medications: I have reviewed the patient's current medications. Scheduled Meds: . budesonide-formoterol  1 puff Inhalation BID  . enoxaparin (LOVENOX) injection  40 mg Subcutaneous Q24H  . hydrochlorothiazide  25 mg Oral Daily  . Ipratropium-Albuterol  1 puff Inhalation Q6H  . nicotine  21 mg Transdermal Daily   Continuous Infusions: . sodium chloride 75 mL/hr at 11/28/13 1347   PRN Meds:.HYDROmorphone (DILAUDID) injection, ondansetron Assessment/Plan:  #Acute on chronic pancreatitis- Confirmed by Ct findings, Lipase elevated to 1196 on admission, up from 109. WBC 11.4. Pain controlled with dilaudid IV. CT abdomen showed diffuse peripancreatic edema and infiltration consistent with acute pancreatitis; no evidence of pancreatic necrosis, abscess, or pseudocyst. LFTS within normal limits, Mg and Phos within normal limits.  -NPO - Cont IVF at 75cc/hr while NPO  -CBGs q4h while NPO  -Cont dilaudid 1 mg IV  q2h prn pain, reaccess in the am.  -Zofran 4mg  ODT q4h prn nausea.   #EtOH, cocaine, tobacco abuse- UDS + for cocaine, opiates  -Cont CiWA protocol given EtOH use 48 hours ago  -consult to social work  -tobacco cessation counseling  -nicotine patch   #HTN- stable, slightly elevated. HCTZ class 3 drug for drug-induced pancreatitis thus will continue while inpatient., but pt has an obvious trigger- alcohol. -continue HCTZ 25 mg daily  -continue to monitor.  #Asthma- continue home inhalers (Symbicort, Combivent) while inpatient.   #DVT PPX- lovenox.  #Code status- Full code  Dispo: Disposition is deferred at this time, awaiting improvement of current medical problems.    The patient does not know have a current PCP (No Pcp Per Patient) and does not know need an [facility] hospital follow-up appointment after discharge.  The patient does not know have transportation limitations that hinder transportation to clinic appointments.  .Services Needed at time of discharge: Y = Yes, Blank = No PT:   OT:   RN:  Equipment:   Other:     LOS: 1 day   Kennis Carina, MD 11/28/2013, 3:34 PM

## 2013-11-29 LAB — CBC WITH DIFFERENTIAL/PLATELET
Eosinophils Relative: 4 % (ref 0–5)
HCT: 41.3 % (ref 39.0–52.0)
Hemoglobin: 14.2 g/dL (ref 13.0–17.0)
Lymphocytes Relative: 17 % (ref 12–46)
MCH: 34.2 pg — ABNORMAL HIGH (ref 26.0–34.0)
MCV: 99.5 fL (ref 78.0–100.0)
Monocytes Absolute: 1.2 10*3/uL — ABNORMAL HIGH (ref 0.1–1.0)
Monocytes Relative: 14 % — ABNORMAL HIGH (ref 3–12)
Neutro Abs: 5.5 10*3/uL (ref 1.7–7.7)
Platelets: 193 10*3/uL (ref 150–400)
RDW: 13.1 % (ref 11.5–15.5)
WBC: 8.4 10*3/uL (ref 4.0–10.5)

## 2013-11-29 LAB — GLUCOSE, CAPILLARY
Glucose-Capillary: 126 mg/dL — ABNORMAL HIGH (ref 70–99)
Glucose-Capillary: 132 mg/dL — ABNORMAL HIGH (ref 70–99)
Glucose-Capillary: 170 mg/dL — ABNORMAL HIGH (ref 70–99)

## 2013-11-29 LAB — BASIC METABOLIC PANEL
BUN: 7 mg/dL (ref 6–23)
CO2: 31 mEq/L (ref 19–32)
Chloride: 93 mEq/L — ABNORMAL LOW (ref 96–112)
Creatinine, Ser: 1.11 mg/dL (ref 0.50–1.35)
Glucose, Bld: 134 mg/dL — ABNORMAL HIGH (ref 70–99)
Sodium: 135 mEq/L (ref 135–145)

## 2013-11-29 MED ORDER — HYDROCODONE-ACETAMINOPHEN 10-325 MG PO TABS
1.0000 | ORAL_TABLET | Freq: Four times a day (QID) | ORAL | Status: DC | PRN
  Administered 2013-11-29 – 2013-11-30 (×3): 1 via ORAL
  Filled 2013-11-29 (×4): qty 1

## 2013-11-29 NOTE — Progress Notes (Signed)
  I have seen and examined the patient, and reviewed the daily progress note by Floy Sabina, MS  and discussed the care of the patient with them. Please see my progress note from 11/29/2013 for further details regarding assessment and plan.    Signed:  Kennis Carina, MD 11/29/2013, 7:55 PM

## 2013-11-29 NOTE — Progress Notes (Signed)
Patient eating cheeseburger and fries brought by the family. Patient stated the MD told him they will change his diet. Will call MD.

## 2013-11-29 NOTE — Progress Notes (Signed)
Subjective: Reports significant improvement in his pain since admission. Very hungery and will like something to eat. No vomiting or nausea, no fever since admission.   Objective: Vital signs in last 24 hours: Filed Vitals:   11/29/13 0234 11/29/13 0535 11/29/13 0900 11/29/13 1347  BP:  121/59  130/62  Pulse:  69  83  Temp:  98.3 F (36.8 C)  98.3 F (36.8 C)  TempSrc:  Oral  Oral  Resp:  18  16  Height:      Weight:      SpO2: 100% 95% 96% 99%   Weight change:   Intake/Output Summary (Last 24 hours) at 11/29/13 1956 Last data filed at 11/29/13 0600  Gross per 24 hour  Intake   1125 ml  Output      0 ml  Net   1125 ml   General appearance: alert, cooperative and appears stated age Head: Normocephalic, without obvious abnormality, atraumatic Lungs: clear to auscultation bilaterally Heart: regular rate and rhythm and S1, S2 normal Abdomen: soft, mild tenderness in epigastric region; bowel sounds normaocative, no masses or  organomegaly Pulses: 2+ and symmetric  Lab Results: Basic Metabolic Panel:  Recent Labs Lab 11/26/13 1136 11/27/13 0345 11/28/13 0535 11/29/13 1026  NA 135 129* 131* 135  K 4.2 3.6 3.7 3.9  CL 96 90* 91* 93*  CO2 26 24 32 31  GLUCOSE 111* 135* 102* 134*  BUN 12 7 5* 7  CREATININE 1.29 1.12 1.20 1.11  CALCIUM 9.3 9.3 9.0 9.3  MG  --  1.9  --   --   PHOS  --  2.5  --   --    Liver Function Tests:  Recent Labs Lab 11/27/13 0345 11/28/13 0535  AST 19 14  ALT 15 10  ALKPHOS 86 78  BILITOT 0.8 0.9  PROT 8.1 7.6  ALBUMIN 4.2 3.7    Recent Labs Lab 11/26/13 1136 11/27/13 0345  LIPASE 109* 1196*   No results found for this basename: AMMONIA,  in the last 168 hours CBC:  Recent Labs Lab 11/27/13 0345 11/28/13 0535 11/29/13 1026  WBC 11.4* 10.2 8.4  NEUTROABS 9.6*  --  5.5  HGB 16.5 16.0 14.2  HCT 46.0 45.7 41.3  MCV 95.8 97.6 99.5  PLT 211 215 193   CBG:  Recent Labs Lab 11/28/13 2350 11/29/13 0339 11/29/13 0741  11/29/13 1150 11/29/13 1556 11/29/13 1941  GLUCAP 149* 138* 107* 148* 132* 126*   Urine Drug Screen: Drugs of Abuse     Component Value Date/Time   LABOPIA POSITIVE* 11/27/2013 0519   COCAINSCRNUR POSITIVE* 11/27/2013 0519   LABBENZ NONE DETECTED 11/27/2013 0519   AMPHETMU NONE DETECTED 11/27/2013 0519   THCU NONE DETECTED 11/27/2013 0519   LABBARB NONE DETECTED 11/27/2013 0519    Alcohol Level: No results found for this basename: ETH,  in the last 168 hours Urinalysis:  Recent Labs Lab 11/26/13 1200 11/27/13 0519  COLORURINE YELLOW YELLOW  LABSPEC 1.023 1.023  PHURINE 7.0 5.0  GLUCOSEU NEGATIVE NEGATIVE  HGBUR NEGATIVE NEGATIVE  BILIRUBINUR NEGATIVE NEGATIVE  KETONESUR 15* 40*  PROTEINUR NEGATIVE NEGATIVE  UROBILINOGEN 0.2 0.2  NITRITE NEGATIVE NEGATIVE  LEUKOCYTESUR NEGATIVE NEGATIVE    Micro Results: No results found for this or any previous visit (from the past 240 hour(s)). Studies/Results: No results found. Medications: I have reviewed the patient's current medications. Scheduled Meds: . budesonide-formoterol  1 puff Inhalation BID  . enoxaparin (LOVENOX) injection  40 mg Subcutaneous Q24H  .  hydrochlorothiazide  25 mg Oral Daily  . Ipratropium-Albuterol  1 puff Inhalation Q6H  . nicotine  21 mg Transdermal Daily   Continuous Infusions: . sodium chloride 75 mL/hr at 11/29/13 1640   PRN Meds:.HYDROcodone-acetaminophen, ondansetron Assessment/Plan:  #Acute on chronic pancreatitis- Confirmed by Ct findings, Lipase elevated to 1196 on admission, up from 109. WBC 11.4. CT abdomen showed diffuse peripancreatic edema and infiltration consistent with acute pancreatitis; no evidence of pancreatic necrosis, abscess, or pseudocyst. LFTS within normal limits, Mg and Phos within normal limits.  - Commence Clear liquid diet and advance as pt tolerates. - Cont IVF at 75cc/hr. -CBGs q4h while NPO  - Changed IV dilaudid to 10-325 of norco- Q6H for pain -Zofran 4mg   ODT q4h prn nausea.  - Repeat BMP- Showed electrolytes have returned to normal, Repeat CBC revealed WBC now WNL.  #EtOH, cocaine, tobacco abuse- UDS + for cocaine, opiates  -Cont CiWA protocol given EtOH use 48 hours ago  -consult to social work  -tobacco cessation counseling  -nicotine patch   #HTN- stable, slightly elevated. HCTZ class 3 drug for drug-induced pancreatitis thus will continue while inpatient., but pt has an obvious trigger- alcohol. -continue HCTZ 25 mg daily  -continue to monitor.  #Asthma- continue home inhalers (Symbicort, Combivent) while inpatient.   #DVT PPX- lovenox.  #Code status- Full code  Dispo: Disposition is deferred at this time, awaiting improvement of current medical problems.    The patient does not know have a current PCP (No Pcp Per Patient) and does not know need an [facility] hospital follow-up appointment after discharge.  The patient does not know have transportation limitations that hinder transportation to clinic appointments.  .Services Needed at time of discharge: Y = Yes, Blank = No PT:   OT:   RN:   Equipment:   Other:     LOS: 2 days   Kennis Carina, MD 11/29/2013, 7:56 PM

## 2013-11-29 NOTE — Progress Notes (Signed)
Subjective:  Patient reports that he is feeling better this morning. His pain is currently 4/10. He slept "on and off."  He denies fever, chills, nausea or vomiting. Denies chest pain or SOB. His appetite is improved and he would like to eat today. Still no BM but passing flatus.   Objective: Vital signs in last 24 hours: Filed Vitals:   11/28/13 2136 11/29/13 0234 11/29/13 0535 11/29/13 0900  BP: 134/65  121/59   Pulse: 75  69   Temp: 98.8 F (37.1 C)  98.3 F (36.8 C)   TempSrc: Oral  Oral   Resp: 16  18   Height:      Weight:      SpO2: 100% 100% 95% 96%   Weight change:   Intake/Output Summary (Last 24 hours) at 11/29/13 1050 Last data filed at 11/29/13 0600  Gross per 24 hour  Intake   1800 ml  Output      0 ml  Net   1800 ml   Physical Exam:  General: No acute distress, resting in bed  HEENT: vision grossly intact, oropharynx clear, no lesions, no tonsillar erythema or discharge Lungs: clear to ascultation bilaterallly, no crackles, wheezing or rhonci. No increased work of breathing noted  Heart: regular rate and rhythm, no murmurs, rubs or gallops  Abdomen: Mild epigastric tenderness. Appears mildly distended, unchanged from yesterday. No rebound tenderness or guarding. Bowel sounds present.  Extremities: 2+ dorsalis pedis and posterior tibial pulses bilaterally, no cyanosis, clubbing, or edema Neurologic: alert & grossly oriented X3  Lab Results: CBC    Component Value Date/Time   WBC 8.4 11/29/2013 1026   RBC 4.15* 11/29/2013 1026   HGB 14.2 11/29/2013 1026   HCT 41.3 11/29/2013 1026   PLT 193 11/29/2013 1026   MCV 99.5 11/29/2013 1026   MCH 34.2* 11/29/2013 1026   MCHC 34.4 11/29/2013 1026   RDW 13.1 11/29/2013 1026   LYMPHSABS 1.4 11/29/2013 1026   MONOABS 1.2* 11/29/2013 1026   EOSABS 0.3 11/29/2013 1026   BASOSABS 0.0 11/29/2013 1026    BMET    Component Value Date/Time   NA 135 11/29/2013 1026   K 3.9 11/29/2013 1026   CL 93* 11/29/2013 1026   CO2 31  11/29/2013 1026   GLUCOSE 134* 11/29/2013 1026   BUN 7 11/29/2013 1026   CREATININE 1.11 11/29/2013 1026   CALCIUM 9.3 11/29/2013 1026   GFRNONAA 78* 11/29/2013 1026   GFRAA >90 11/29/2013 1026    Medications: I have reviewed the patient's current medications. Scheduled Meds: . budesonide-formoterol  1 puff Inhalation BID  . enoxaparin (LOVENOX) injection  40 mg Subcutaneous Q24H  . hydrochlorothiazide  25 mg Oral Daily  . Ipratropium-Albuterol  1 puff Inhalation Q6H  . nicotine  21 mg Transdermal Daily   Continuous Infusions: . sodium chloride 75 mL/hr at 11/29/13 0322   PRN Meds:.HYDROmorphone (DILAUDID) injection, ondansetron  Assessment/Plan:  1) Acute on chronic pancreatitis- This is likely due to recent alcohol use, especially considering this has been trigger in past. BISAP score of 0 (<1% mortality risk). Pain currently controlled with dilaudid IV. CT abdomen (11/30) showed diffuse peripancreatic edema and infiltration consistent with acute pancreatitis; no evidence of pancreatic necrosis, abscess, or psuedocyst. Lipase went from 109 to 1196 on admission (11/30). Will continue to monitor patient, provide pain relief and IV fluids.  -Transition to clear liquid diet this AM. If patient tolerates this, will switch to regular diet this afternoon.   -Continue NS at 75  cc/hr  -CBGs q4h while NPO  - Transition from Dilaudid IV 1mg  q2hr prn to oral pain regimen of Hydrocodone-Acetaminophen 10-325 mg q6hr.  - Continue Zofran 4mg  ODT q4h prn for nausea   2) EtOH, cocaine, tobacco abuse- UDS on admission was positive for cocaine and opiates.  - Continue CiWA protocol given EtOH use  -Tobacco cessation counseling  -Continue nicotine patch   3) HTN- Currently stable and asymptomatic. -continue HCTZ 25 mg/day  -continue to monitor   4) Asthma- well controlled on current regimen. -continue home inhalers (Symbicort, Combivent).   DVT PPX- lovenox   Code status- Full code   Dispo:  Disposition is deferred at this time, awaiting improvement of current medical problems. Anticipated discharge in approximately 1-2 day(s).   The patient does not have a current PCP (No Pcp Per Patient) and does need an [facility] hospital follow-up appointment after discharge.     This is a Psychologist, occupational Note.  The care of the patient was discussed with Dr. Mariea Clonts and the assessment and plan formulated with their assistance.  Please see their attached note for official documentation of the daily encounter.   LOS: 2 days   Floy Sabina, Med Student 11/29/2013, 10:50 AM

## 2013-11-30 LAB — GLUCOSE, CAPILLARY: Glucose-Capillary: 170 mg/dL — ABNORMAL HIGH (ref 70–99)

## 2013-11-30 NOTE — Progress Notes (Signed)
Subjective:  Patient reports that he is feeling well today. He endorses eating a burger and fries last night. He denies any nausea or vomiting. He slept well last night and denies waking up with pain. Pain is 2/10 at this time. He took last dose of Norco this AM. Denies fever, chills, chest pain or SOB. Still no BM, but passing flatus.   Objective: Vital signs in last 24 hours: Filed Vitals:   11/29/13 0900 11/29/13 1347 11/29/13 2056 11/30/13 0509  BP:  130/62 135/70 129/84  Pulse:  83 90 90  Temp:  98.3 F (36.8 C) 98.6 F (37 C) 99 F (37.2 C)  TempSrc:  Oral Oral Oral  Resp:  16 16 15   Height:      Weight:      SpO2: 96% 99% 97% 97%   Weight change:   Intake/Output Summary (Last 24 hours) at 11/30/13 1353 Last data filed at 11/30/13 0600  Gross per 24 hour  Intake   2160 ml  Output      0 ml  Net   2160 ml   Physical Exam: General: No acute distress, sitting up at edge of bed Lungs: clear to ascultation bilaterallly, no crackles, wheezing or rhonci. No increased work of breathing noted  Heart: regular rate and rhythm, no murmurs, rubs or gallops  Abdomen: Non-tender. Appears mildly distended, improved from yesterday. No rebound tenderness or guarding. Bowel sounds present.  Extremities: 2+ dorsalis pedis and posterior tibial pulses bilaterally, no cyanosis, clubbing, or edema Neurologic: alert & grossly oriented X3  Lab Results: No new labs in last 24 hrs  Medications: I have reviewed the patient's current medications. Scheduled Meds: . budesonide-formoterol  1 puff Inhalation BID  . enoxaparin (LOVENOX) injection  40 mg Subcutaneous Q24H  . hydrochlorothiazide  25 mg Oral Daily  . Ipratropium-Albuterol  1 puff Inhalation Q6H  . nicotine  21 mg Transdermal Daily   Continuous Infusions: . sodium chloride 75 mL/hr at 11/30/13 0651   PRN Meds:.HYDROcodone-acetaminophen, ondansetron  Assessment/Plan:  1) Acute on chronic pancreatitis- This is likely due to  recent alcohol use, especially considering this has been trigger in past. BISAP score of 0 (<1% mortality risk). Pain currently controlled with Norco (Hydrocodone-Acetaminophen). CT abdomen (11/30) showed diffuse peripancreatic edema and infiltration consistent with acute pancreatitis; no evidence of pancreatic necrosis, abscess, or psuedocyst. Lipase went from 109 to 1196 on admission (11/30).  - Patient tolerating regular diet without N/V.  - Pain was well controlled in past 24 hrs with Hydrocodone-Acetaminophen 10-325 mg q6hr as patient only required 2 doses. Advised patient to continue Hydrocodone-Acetaminophen 5-325 mg q8hr prn as needed for pain at home for next few days.  - Continue Zofran 4mg  ODT q4h prn for nausea   2) EtOH, cocaine, tobacco abuse- UDS on admission was positive for cocaine and opiates.   -Tobacco cessation counseling  -Continue nicotine patch   3) HTN- Currently stable and asymptomatic.  -continue HCTZ 25 mg/day  -continue to monitor   4) Asthma- well controlled on current regimen.  -continue home inhalers (Symbicort, Combivent).   DVT PPX- lovenox discontinued at discharge   Code status- Full code   Dispo: Discharge home today. Patient will follow-up on 12/07/13 at internal medicine clinic.   This is a Psychologist, occupational Note.  The care of the patient was discussed with Dr. Mariea Clonts and the assessment and plan formulated with their assistance.  Please see their attached note for official documentation of the daily encounter.  LOS: 3 days   Ian Patrick, Med Student 11/30/2013, 1:53 PM

## 2013-11-30 NOTE — Progress Notes (Signed)
Discharge home. Home discharge instruction given, no question verbalized. 

## 2013-11-30 NOTE — Progress Notes (Addendum)
Subjective: Pt feels much better. Minimal pain, tolerating oral intake. Family brought a cheese burger for pt yesterday night, which he ate, though he was supposed to be on full liquid diet. But there was no nausea, vomiting or abdominal pain. No fever. Pt slept well throughout the night.  Objective: Vital signs in last 24 hours: Filed Vitals:   11/29/13 0900 11/29/13 1347 11/29/13 2056 11/30/13 0509  BP:  130/62 135/70 129/84  Pulse:  83 90 90  Temp:  98.3 F (36.8 C) 98.6 F (37 C) 99 F (37.2 C)  TempSrc:  Oral Oral Oral  Resp:  16 16 15   Height:      Weight:      SpO2: 96% 99% 97% 97%   Weight change:   Intake/Output Summary (Last 24 hours) at 11/30/13 1321 Last data filed at 11/30/13 0600  Gross per 24 hour  Intake   2160 ml  Output      0 ml  Net   2160 ml   General appearance: alert, cooperative and appears stated age Head: Normocephalic, without obvious abnormality, atraumatic Lungs: clear to auscultation bilaterally Heart: regular rate and rhythm and S1, S2 normal Abdomen: soft, no tenderness to palpation in epigastric region; bowel sounds normaocative, no masses or  organomegaly Pulses: 2+ and symmetric, no pedal edema.  Lab Results: Basic Metabolic Panel:  Recent Labs Lab 11/26/13 1136 11/27/13 0345 11/28/13 0535 11/29/13 1026  NA 135 129* 131* 135  K 4.2 3.6 3.7 3.9  CL 96 90* 91* 93*  CO2 26 24 32 31  GLUCOSE 111* 135* 102* 134*  BUN 12 7 5* 7  CREATININE 1.29 1.12 1.20 1.11  CALCIUM 9.3 9.3 9.0 9.3  MG  --  1.9  --   --   PHOS  --  2.5  --   --    Liver Function Tests:  Recent Labs Lab 11/27/13 0345 11/28/13 0535  AST 19 14  ALT 15 10  ALKPHOS 86 78  BILITOT 0.8 0.9  PROT 8.1 7.6  ALBUMIN 4.2 3.7    Recent Labs Lab 11/26/13 1136 11/27/13 0345  LIPASE 109* 1196*   CBC:  Recent Labs Lab 11/27/13 0345 11/28/13 0535 11/29/13 1026  WBC 11.4* 10.2 8.4  NEUTROABS 9.6*  --  5.5  HGB 16.5 16.0 14.2  HCT 46.0 45.7 41.3  MCV  95.8 97.6 99.5  PLT 211 215 193   CBG:  Recent Labs Lab 11/29/13 1150 11/29/13 1556 11/29/13 1941 11/29/13 2348 11/30/13 0435 11/30/13 0741  GLUCAP 148* 132* 126* 170* 115* 132*   Urine Drug Screen: Drugs of Abuse     Component Value Date/Time   LABOPIA POSITIVE* 11/27/2013 0519   COCAINSCRNUR POSITIVE* 11/27/2013 0519   LABBENZ NONE DETECTED 11/27/2013 0519   AMPHETMU NONE DETECTED 11/27/2013 0519   THCU NONE DETECTED 11/27/2013 0519   LABBARB NONE DETECTED 11/27/2013 0519    Urinalysis:  Recent Labs Lab 11/26/13 1200 11/27/13 0519  COLORURINE YELLOW YELLOW  LABSPEC 1.023 1.023  PHURINE 7.0 5.0  GLUCOSEU NEGATIVE NEGATIVE  HGBUR NEGATIVE NEGATIVE  BILIRUBINUR NEGATIVE NEGATIVE  KETONESUR 15* 40*  PROTEINUR NEGATIVE NEGATIVE  UROBILINOGEN 0.2 0.2  NITRITE NEGATIVE NEGATIVE  LEUKOCYTESUR NEGATIVE NEGATIVE   Medications: I have reviewed the patient's current medications. Scheduled Meds: . budesonide-formoterol  1 puff Inhalation BID  . enoxaparin (LOVENOX) injection  40 mg Subcutaneous Q24H  . hydrochlorothiazide  25 mg Oral Daily  . Ipratropium-Albuterol  1 puff Inhalation Q6H  . nicotine  21 mg Transdermal Daily   Continuous Infusions: . sodium chloride 75 mL/hr at 11/30/13 0651   PRN Meds:.HYDROcodone-acetaminophen, ondansetron Assessment/Plan:  #Acute on chronic pancreatitis- Confirmed by Ct findings, Lipase elevated to 1196 on admission, up from 109. WBC 11.4. CT abdomen showed diffuse peripancreatic edema and infiltration consistent with acute pancreatitis; no evidence of pancreatic necrosis, abscess, or pseudocyst. LFTS within normal limits, Mg and Phos within normal limits. Started regular diet today, after eating cheese burger brought by his family last night. Well tolerated, no pain, nausea or vomiting. - Discont IVF at 75cc/hr. - 10-325 of norco- Q6H for pain -Zofran 4mg  ODT q4h prn nausea.  - Repeat BMP- Showed electrolytes have returned to  normal, Repeat CBC revealed WBC now WNL.  #EtOH, cocaine, tobacco abuse- UDS + for cocaine, opiates  -Cont CiWA protocol given EtOH use 48 hours ago  -consult to social work- Pt not ready to quit.  -tobacco cessation counseling  -nicotine patch   #HTN- stable, slightly elevated. HCTZ class 3 drug for drug-induced pancreatitis thus will continue while inpatient., but pt has an obvious trigger- alcohol. -continue HCTZ 25 mg daily  -continue to monitor.  #Asthma- continue home inhalers (Symbicort, Combivent) while inpatient.   #DVT PPX- lovenox.  #Code status- Full code  Dispo: Disposition is deferred at this time, awaiting improvement of current medical problems.    The patient does not know have a current PCP (No Pcp Per Patient) and does not know need an [facility] hospital follow-up appointment after discharge.  The patient does not know have transportation limitations that hinder transportation to clinic appointments.  .Services Needed at time of discharge: Y = Yes, Blank = No PT:   OT:   RN:   Equipment:   Other:     LOS: 3 days   Kennis Carina, MD 11/30/2013, 1:21 PM

## 2013-12-01 NOTE — Progress Notes (Signed)
Clinical Social Work Department BRIEF PSYCHOSOCIAL ASSESSMENT 12/01/2013  Patient:  Ian Patrick, Ian Patrick     Account Number:  [id number]     Admit date:  11/27/2013  Clinical Social Worker:  Leron Croak, CLINICAL SOCIAL WORKER  Date/Time:  11/30/2013 12:00 M  Referred by:  Physician  Date Referred:  11/29/2013 Referred for  Substance Abuse   Other Referral:   Interview type:  Patient Other interview type:    PSYCHOSOCIAL DATA Living Status:  SIGNIFICANT OTHER Admitted from facility:   Level of care:   Primary support name:  Paulla Fore   [phone] Primary support relationship to patient:  PARTNER Degree of support available:   Pt has good support    CURRENT CONCERNS Current Concerns  Post-Acute Placement   Other Concerns:    SOCIAL WORK ASSESSMENT / PLAN CSW spoke with the Pt at the bedside to discuss his current substance abusse. Pt was not aware of the consult. CSW introduced self and the reason for the visit. Pt was not wanting to speak with CSW about rehab options or resources. When CSW asked about Pt alcohol addiciton, Pt stated "I probably should slow down but I'm not ready to stop right now." Pt stated that he drinks about "3-4 drinks of liquor a day" and that "if he needs to he will slow down". Pt does not desire CSW assistance at this time and CSW is signing off.   Assessment/plan status:  Information/Referral to [facility] Other assessment/ plan:   Information/referral to community resources:   Pt refused information at this time    PATIENT'S/FAMILY'S RESPONSE TO PLAN OF CARE: Pt appreciative for asistance and visit, however is not wanting treatment at this time.        Leron Croak [facility]  [address];  [phone] Phone: [phone]

## 2013-12-04 NOTE — Discharge Summary (Signed)
Name: Ian Patrick MRN: [id number] DOB: Aug 04, 1966 47 y.o. PCP: No Pcp Per Patient  Date of Admission: 11/27/2013  3:19 AM Date of Discharge: 11/30/2013 Attending Physician: No att. providers found  Discharge Diagnosis:  Principal Problem:   Pancreatitis Active Problems:   Asthma, chronic   Hypertension   Excessive drinking alcohol   Acute pancreatitis  Discharge Medications:   Medication List         budesonide-formoterol 80-4.5 MCG/ACT inhaler  Commonly known as:  SYMBICORT  Inhale 1 puff into the lungs 2 (two) times daily.     hydrochlorothiazide 25 MG tablet  Commonly known as:  HYDRODIURIL  Take 25 mg by mouth daily.     HYDROcodone-acetaminophen 5-325 MG per tablet  Commonly known as:  NORCO  Take 1-2 tablets by mouth every 6 (six) hours as needed.     Ipratropium-Albuterol 20-100 MCG/ACT Aers respimat  Commonly known as:  COMBIVENT  Inhale 1 puff into the lungs every 6 (six) hours.     ondansetron 4 MG disintegrating tablet  Commonly known as:  ZOFRAN ODT  Take 1 tablet (4 mg total) by mouth every 8 (eight) hours as needed for nausea or vomiting. 4mg  ODT q4 hours prn nausea/vomit        Disposition and follow-up:   [name] was discharged from [facility] in Good condition.  At the hospital follow up visit please address:  1.  Resolution of abdominal pain, counsel pt on abstaining  from alcohol intake as this tends to provoke his acute pancreatitis. Pt not ready to quit at discharge, but can be followed up as an out-pt, with educational resources.  2.  On the day of discharge pt reported that his blood sugars drop into the 60s ( checks it using his signif other's glucometer) he is not a DM, and does not take any medication that can explain this. No similar results seen throughout hospital stay. Please follow up.  Follow-up Appointments:     Follow-up Information   Follow up with Genelle Gather, MD On 12/07/2013. (8:45am  )    Specialty:  Internal Medicine   Contact information:   [address] [place] [address] [phone]       Discharge Instructions: Discharge Orders   Future Appointments Provider Department Dept Phone   12/07/2013 8:45 AM Genelle Gather, MD [facility] [phone]   Future Orders Complete By Expires   Call MD for:  persistant nausea and vomiting  As directed    Call MD for:  severe uncontrolled pain  As directed    Call MD for:  temperature >100.4  As directed    Diet - low sodium heart healthy  As directed    Increase activity slowly  As directed       Consultations:    Procedures Performed:  Ct Abdomen Pelvis W Contrast  11/27/2013   CLINICAL DATA:  Persistent mid and upper abdominal pain with nausea and vomiting since yesterday. Elevated lipase. Patient was here yesterday and was diagnosed with alcoholic pancreatitis.  EXAM: CT ABDOMEN AND PELVIS WITH CONTRAST  TECHNIQUE: Multidetector CT imaging of the abdomen and pelvis was performed using the standard protocol following bolus administration of intravenous contrast.  CONTRAST:  OMNIPAQUE IOHEXOL 300 MG/ML  SOLN  COMPARISON:  None.  FINDINGS: The lung bases are clear.  There is diffuse infiltration around the pancreas and in the upper epigastric region with peripancreatic edema. Edema extends into the  left anterior pericolic gutter and left anterior pararenal space. Focal extension to the posterior spleen. There is normal enhancement of the pancreatic tissue suggesting no evidence of necrosis. Pancreatic duct is not dilated. No loculated fluid collections to suggest pseudocyst.  The liver, spleen, gallbladder, adrenal glands, abdominal aorta, inferior vena cava, and retroperitoneal lymph nodes are unremarkable. Small parenchymal cysts in the kidneys without evidence of solid mass or hydronephrosis. The stomach and small bowel are not abnormally distended. The colon is decompressed but appears  to demonstrate diffuse colonic wall thickening. This could be due to under distention but suggest possible colitis. No free air or free fluid in the abdomen.  Pelvis: The appendix is normal. Prostate gland is not enlarged. Bladder wall is not thickened. No free or loculated pelvic fluid collections. No evidence of diverticulitis. The normal alignment of the lumbar spine. No destructive bone lesions.  IMPRESSION: Diffuse peripancreatic edema and infiltration consistent with acute pancreatitis. No evidence of pancreatic necrosis, abscess, or pseudocyst. Nonspecific appearance of the colon suggesting diffuse wall thickening. This may be due to under distention but colitis is not excluded.   Electronically Signed   By: Burman Nieves M.D.   On: 11/27/2013 06:49   Admission HPI: Chief Complaint: abdominal pain  History of Present Illness:  Mr. Ian Patrick is a 47 year old man with history of chronic pancreatitis, alcohol and cocaine abuse, HTN, asthma who presents with epigastric abdominal pain x 2 days. Patient was seen in ED yesterday with same complaint, returned today due to intractable pain.  Patient states that Friday (11/28), he drank several ("3 or more") vodka-orange juice drinks and soon after began having epigastric abdominal pain. Yesterday in ED, he states he was given Percocet which eased the pain enough so he could sleep, but pain returned this morning and worse. He has also had chills and felt somewhat nauseous, vomited once (clear, NBNB) this morning. He has reportedly had two previous episodes of pancreatitis (last one "several years ago" and required 3 day hospitalization), both following bouts of drinking.  Denies fever, chest pain, change in bowel or bladder habits. Last BM yesterday, no blood.  He smokes 1/2 PPD (x 20 years), drinks 1-2 mixed drinks daily, denies illicits. He moved to [place] 2 months ago from [place] and lives here with his [name], works as a Psychologist, occupational.  In ED, patient had CT  abdomen which showed diffuse peripancreatic edema and infiltration consistent with acute pancreatitis; no evidence of pancreatic necrosis, abscess, or psuedocyst. His lipase is also elevated to 1196 from 109 on 11/29 (yesterday). He received pain control, IVFs and was admitted to IMTS.   Physical Exam:  Blood pressure 155/78, pulse 71, temperature 97.9 F (36.6 C), temperature source Oral, resp. rate 14, SpO2 100.00%.  General: alert, cooperative but in distress 2/2 pain, asking for pain meds HEENT: NCAT, vision grossly intact, oropharynx clear and non-erythematous  Neck: supple, no lymphadenopathy Lungs: clear to ascultation bilaterally, normal work of respiration, no wheezes, rales, ronchi Heart: regular rate and rhythm, no murmurs, gallops, or rubs Abdomen: mildly TTP in epigastric area, ?mildly distended, soft, +bowel sounds  Extremities: 2+ DP/PT pulses bilaterally, no cyanosis, clubbing, or edema Neurologic: alert & oriented X3, cranial nerves II-XII intact, strength grossly intact, sensation intact to light touch    Hospital Course by problem list: Principal Problem:   Acute Pancreatitis Active Problems:   Asthma, chronic   Hypertension   Excessive drinking alcohol   # Acute on chronic pancreatitis- Pt presented  with epigastric abdominal pain, labs revealed- wbc- 11.4, elevated lipase- 1196 ( elevated from 109- 11/29 when he initially presented and was sent home on pain meds) was very likely provoked by recent alcohol intake, especially considering this has been trigger in past. BISAP score of 0 (<1% mortality risk). CT abdomen showed diffuse peripancreatic edema and infiltration consistent with acute pancreatitis; no evidence of pancreatic necrosis, abscess, or psuedocyst. LFTS within normal limits, Mg and Phos within normal limits, BMP- Na- 129. Troponin x 1 negative (POC trop negative as well). Pt was admitted, was placed on NPO, IV N/s at 46mls/hr, Pain controlled with dilaudid  IV-1mg  Q2H, zofran 4mg  ODT Q4H, CBGs Q4H. With resolution of abdominal pain, absence of nausea and vomiting, and WBC trending down to normal, pts diet was advanced, and pain meds de-escalated to Hydrocodone-Acetaminophen- 10-325 Q6H, repeat BMP- NA- 135. Pt was discharged home.  #EtOH, cocaine, tobacco abuse- UDS + for cocaine, opiates. Pt also smokes cigarettes. Pt was placed on CiWA protocol given EtOH use 48 hours ago. Social work was consulted for alcohol counselling and cessation, but pt declined any help, saying he wasn't ready to quit.  #HTN- Was stable during admission. Home meds- HCTZ 25 mg daily, a class 3 drug for drug-induced pancreatitis thus was continued while inpatient and on discharge.   #Asthma- continue home inhalers (Symbicort, Combivent) while inpatient and on discharge.   Discharge Vitals:   BP 129/84  Pulse 90  Temp(Src) 99 F (37.2 C) (Oral)  Resp 15  Ht 5\' 8"  (1.727 m)  Wt 234 lb (106.142 kg)  BMI 35.59 kg/m2  SpO2 97%   Signed: Kennis Carina, MD 12/04/2013, 5:11 PM   Time Spent on Discharge: 35 minutes Services Ordered on Discharge: None. Equipment Ordered on Discharge: None.

## 2013-12-04 NOTE — Progress Notes (Signed)
  I have seen and examined the patient, and reviewed the daily progress note by Floy Sabina, MS and discussed the care of the patient with them. Please see my progress note from 12/04/2013 for further details regarding assessment and plan.    Signed:  Kennis Carina, MD 12/04/2013, 5:10 PM

## 2013-12-07 ENCOUNTER — Ambulatory Visit (INDEPENDENT_AMBULATORY_CARE_PROVIDER_SITE_OTHER): Payer: Medicaid Other | Admitting: Internal Medicine

## 2013-12-07 ENCOUNTER — Encounter (INDEPENDENT_AMBULATORY_CARE_PROVIDER_SITE_OTHER): Payer: Self-pay

## 2013-12-07 ENCOUNTER — Encounter: Payer: Self-pay | Admitting: Internal Medicine

## 2013-12-07 VITALS — BP 127/79 | HR 80 | Temp 97.8°F | Ht 69.0 in | Wt 234.4 lb

## 2013-12-07 DIAGNOSIS — K859 Acute pancreatitis without necrosis or infection, unspecified: Secondary | ICD-10-CM

## 2013-12-07 DIAGNOSIS — F1010 Alcohol abuse, uncomplicated: Secondary | ICD-10-CM

## 2013-12-07 DIAGNOSIS — M545 Low back pain, unspecified: Secondary | ICD-10-CM

## 2013-12-07 DIAGNOSIS — Z720 Tobacco use: Secondary | ICD-10-CM | POA: Insufficient documentation

## 2013-12-07 DIAGNOSIS — F1410 Cocaine abuse, uncomplicated: Secondary | ICD-10-CM

## 2013-12-07 DIAGNOSIS — I10 Essential (primary) hypertension: Secondary | ICD-10-CM

## 2013-12-07 DIAGNOSIS — G8929 Other chronic pain: Secondary | ICD-10-CM | POA: Insufficient documentation

## 2013-12-07 DIAGNOSIS — F17200 Nicotine dependence, unspecified, uncomplicated: Secondary | ICD-10-CM

## 2013-12-07 MED ORDER — HYDROCHLOROTHIAZIDE 25 MG PO TABS: 25.0000 mg | ORAL_TABLET | Freq: Every day | ORAL | Status: AC

## 2013-12-07 NOTE — Assessment & Plan Note (Signed)
  Assessment: Progress toward smoking cessation:  smoking the same amount Barriers to progress toward smoking cessation:  lack of motivation to quit Comments: Patient states he smokes about a half a pack per day  Plan: Instruction/counseling given:  I counseled patient on the dangers of tobacco use, advised patient to stop smoking, and reviewed strategies to maximize success. Educational resources provided:  [employer]) brochure

## 2013-12-07 NOTE — Patient Instructions (Signed)
°Emergency Department Resource Guide °1) Find a Doctor and Pay Out of Pocket °Although you won't have to find out who is covered by your insurance plan, it is a good idea to ask around and get recommendations. You will then need to call the office and see if the doctor you have chosen will accept you as a new patient and what types of options they offer for patients who are self-pay. Some doctors offer discounts or will set up payment plans for their patients who do not have insurance, but you will need to ask so you aren't surprised when you get to your appointment. ° °2) Contact Your Local Health Department °Not all health departments have doctors that can see patients for sick visits, but many do, so it is worth a call to see if yours does. If you don't know where your local health department is, you can check in your phone book. The CDC also has a tool to help you locate your state's health department, and many state websites also have listings of all of their local health departments. ° °3) Find a Walk-in Clinic °If your illness is not likely to be very severe or complicated, you may want to try a walk in clinic. These are popping up all over the country in pharmacies, drugstores, and shopping centers. They're usually staffed by nurse practitioners or physician assistants that have been trained to treat common illnesses and complaints. They're usually fairly quick and inexpensive. However, if you have serious medical issues or chronic medical problems, these are probably not your best option. ° °No Primary Care Doctor: °- Call [facility] at  832-8000 - they can help you locate a primary care doctor that  accepts your insurance, provides certain services, etc. °- Physician Referral Service- 1-800-533-3463 ° °Chronic Pain Problems: °Organization         Address  Phone   Notes  °[facility]  (336) 297-2271 Patients need to be referred by their primary care doctor.  ° °Medication  Assistance: °Organization         Address  Phone   Notes  °[place] [facility] [address] °[place], [place] [address] (336) 641-8030 --Must be a resident of [place] °-- Must have NO insurance coverage whatsoever (no Medicaid/ Medicare, etc.) °-- The pt. MUST have a primary care doctor that directs their care regularly and follows them in the community °  °[facility]  (866) 331-1348   °[employer]  (888) 892-1162   ° °Agencies that provide inexpensive medical care: °Organization         Address  Phone   Notes  °[facility]  (336) 832-8035   °[facility]    (336) 832-7272   °[facility] [address] °[place], [place] [address] (336) 832-4777   °[facility] [address], °[place] (336) 271-4999   °[facility]    (336) 373-0678   °[facility]    (336) 272-1050   °[facility] ° [address], [place] Phone:  (336) 832-4444, Fax:  (336) 832-4440 Hours of Operation:  9 am - 6 pm, M-F.  Also accepts Medicaid/Medicare and self-pay.  °[facility] ° [address], [place] Phone: (336) 832-3150, Fax: (336) 832-3151. Hours of Operation:  8:30 am - 5:30 pm, M-F.  Also accepts Medicaid and self-pay.  °[facility] [place] [address], [place] Phone: (336) 878-6027   °[facility] [address], [place], [place] (336)723-1848, Ext. 123 Mondays & Thursdays: 7-9 AM.  First 15 patients are seen on a first come, first serve basis. °  ° °Medicaid-accepting [place] Providers: ° °Organization         Address  Phone   Notes  °[facility] [address], [place] (336) 641-2100 Also accepts self-pay patients.  °[facility] [address], [place] ° (336) 856-9996   °[facility] [address], [place]  (336) 288-8857   °[facility] [address] [place] [address], [place] (336) 299-7000   °Veita Bland [address], [place]  ° (336) 373-1557 Only accepts [place] Access Medicaid patients after they have their name applied to their card.  ° °Self-Pay (no insurance) in [place]: ° °Organization         Address  Phone   Notes  °Sickle Cell Patients, [facility] [address], [place] (336) 832-1970   °[facility] [address], [place] (336) 832-4400   °[facility] ° [address], [place] (336) 992-4800   °[facility]/Dr. Osei-Bonsu ° [address] [place] [address], [place] or [address], [place] (336) 841-8500 Phone number for both [place] and [place] locations is the same.  °[facility] [address], [place] (336) 299-0000   °[facility] [address] [place] [address], [place] or [address] (336) 852-7530 °(336) 878-2260   °[facility] [address], [place] (336) 350-1642, phone; (336) 294-5005, fax Sees patients 1st and 3rd Saturday of every month.  Must not qualify for public or private insurance (i.e. Medicaid, Medicare, [place] Health Choice, Veterans' Benefits) • Household income should be no more than 200% of the poverty level •The clinic cannot treat you if you are pregnant or think you are pregnant • Sexually transmitted diseases are not treated at the clinic.  ° ° °Dental Care: °Organization         Address  Phone  Notes  °[place] [facility] [address], [place] (336) 641-6152 Accepts children up to age 21 who are enrolled in Medicaid or [place] Health Choice; pregnant women with a Medicaid card; and children who have applied for Medicaid or [place] Health Choice, but were declined, whose parents can pay a reduced fee at time of service.  °[place]  [facility] [place]  [address], [place] (336) 641-7733 Accepts children up to age 21 who are enrolled in Medicaid or [place] Health Choice; pregnant women with a Medicaid card; and children who have applied for Medicaid or [place] Health Choice, but were declined, whose parents can pay a reduced fee at time of service.  °[facility] ° [address], [place] (336) 641-4533 Patients are seen by appointment only. Walk-ins are not accepted. [facility] will see patients 18 years of age and older. °Monday - Tuesday (8am-5pm) °Most Wednesdays (8:30-5pm) °$30 per visit, cash only  °[facility] ° [address], [place] (336) 641-4533 Patients are seen by appointment only. Walk-ins are not accepted. [facility] will see patients 18 years of age and older. °One   Wednesday Evening (Monthly: Volunteer Based).  $30 per visit, cash only  °[facility]  (919) 537-3737 for adults; Children under age 4, call Graduate Pediatric Dentistry at (919) [phone]. Children aged 4-14, please call (919) [phone] to request a pediatric application. ° Dental services are provided in all areas of dental care including fillings, crowns and bridges, complete and partial dentures, implants, gum treatment, root canals, and extractions. Preventive care is also provided. Treatment is provided to both adults and children. °Patients are selected via a lottery and there is often a waiting list. °  °[facility] [address], °[place] ° (336) 763-8833 [email or web address] °  °[facility] [address], [place], [place] (336)723-1848, Ext. 123 Second and Fourth Thursday of each month, opens at 6:30 AM; Clinic ends at 9 AM.  Patients are seen on a first-come first-served basis, and a limited number are seen during each clinic.  ° °[facility] ° [address], [place], [place] (336) 723-7904    Eligibility Requirements °You must have lived in [place], [place], or [place] counties for at least the last three months. °  You cannot be eligible for state or federal sponsored healthcare insurance, including Veterans Administration, Medicaid, or Medicare. °  You generally cannot be eligible for healthcare insurance through your employer.  °  How to apply: °Eligibility screenings are held every Tuesday and Wednesday afternoon from 1:00 pm until 4:00 pm. You do not need an appointment for the interview!  °[facility] [address], [place], [place] 336-631-2330   °[facility] [facility]  336-342-8273   °[facility]  336-703-3100   ° [facility]  336-570-6415   ° °Behavioral Health Resources in the Community: °Intensive Outpatient Programs °Organization         Address  Phone  Notes  °[facility] [address], [place], [place] 336-878-6098   °[facility] [address], [place], [place] 336-832-9800   °[facility] [address], [place], [place] ° 336-882-2125   °[facility] [address],  °[place], [place] 1-800-853-5163 or 336-641-4981   °Substance Abuse Resources °Organization         Address  Phone  Notes  °[facility]  336-882-2125   °[facility]  336-784-9470   °[facility]  336-285-9073   °[facility]  336-845-3988   °Residential & Outpatient Substance Abuse Program  1-800-659-3381   °Psychological Services °Organization         Address  Phone  Notes  °[facility]  336- 832-9600   °[facility]  336- 378-7881   °[facility] [address], [place] 1-800-853-5163 or 336-641-4981   ° °Mobile Crisis Teams °Organization         Address  Phone  Notes  °[facility], Mobile Crisis Care Unit  1-877-626-1772   °Assertive °[facility] ° [address]  [place], [place] 336-834-9664   °Sharon DeEsch [address] °[place] 336-554-5454   ° °Self-Help/Support Groups °Organization         Address  Phone             Notes  °[facility]  - variety of support groups  336- 373-1402 Call for more information  °Narcotics Anonymous (NA), [facility] [address], °[place]   2 meetings at this location  ° °  Residential Treatment Programs °Organization         Address  Phone  Notes  °[facility] [address],    °[place]  1-866-801-8205   °[facility] ° [address], [place], [place] 704-293-8524   °[facility] [address], [place] 336-845-3988 Admissions: 8am-3pm M-F  °[facility] [address]    °[place], [place] 336-841-1104   °[facility] [address], [place], [place] 336-379-7146   °[facility] [address]  °[place], [place] 336-285-9073   °[facility] Intensive Outpatient [address], [place], [place] 336-852-3033   °[facility] [address]  °[place], [place] 1-877-615-2722 or 336-784-9470   °[facility] [address] [place], [place] 336-227-7417 Accepts Medicaid  °[facility] [address]  °[place] 1-800-659-3381 Substance Abuse/Addiction Treatment  ° °[place] Behavioral Health Resources °Organization         Address  Phone  Notes  °[facility]  (888) 581-9988   °Julie Brannon, PhD [address] [place], [place]   (336) 349-5553 or (336) 951-0000   °[facility]   [address] °[place], [place] (336) 349-4454   °[facility] [address], [place], [place] (336) 342-8316 Insurance/Medicaid/sponsorship through [facility]  °[facility] [address]                                    [place], [place] (336) 342-8316 Therapy/tele-psych/case    °[facility] [address]  ° [place],  (336) 349-2233    °Dr. Arfeen  (336) 349-4544   °[facility] [place]  [employer] [place] [facility] 1) [address], [place] °2) [address], [place] °3)  [address], [place] (336) 349-3220 °(336) 342-7768 ° °(336) 342-8140   °[place] [facility] (336) 342-1394 or (336) 342-3537 (After Hours)    ° ° °

## 2013-12-07 NOTE — Progress Notes (Signed)
Patient ID: Ian Patrick, male   DOB: 19-Nov-1966, 47 y.o.   MRN: [id number]  Subjective:   Patient ID: Ian Patrick male   DOB: [birth year] 47 y.o.   MRN: [id number]  HPI: Ian Patrick is a 47 y.o. M w/ PMH HTN, cocaine abuse, tobacco abuse, EtOH abuse, and chronic pancreatitis who was recently admitted with acute pancreatitis presents for a hospital follow up appointment.   Pt was admitted on 11/30 with alcohol induced pancreatitis after drinking 3 or more vodka drinks. His lipase this hospitalization was up to 1196 from 109 on admission; he was hydrated and provided antiemetics and pain relief and was ready for d/c home on 11/30/13. Today he denies any further abdominal pain, nausea, or vomiting. He denies further EtOH use.   He endorses chronic back pain in his lumbosacral spine. He denies radiculopathy. He states that he was previously on hydrocodone 10mg  for his pain. His UDS was positive on admission for cocaine, which he states that he only does during the holidays. He states that he snorts the drug but does not smoke it.    Past Medical History  Diagnosis Date  . Asthma   . Pancreatitis   . Back pain, chronic    Current Outpatient Prescriptions  Medication Sig Dispense Refill  . budesonide-formoterol (SYMBICORT) 80-4.5 MCG/ACT inhaler Inhale 1 puff into the lungs 2 (two) times daily.       . hydrochlorothiazide (HYDRODIURIL) 25 MG tablet Take 1 tablet (25 mg total) by mouth daily.  30 tablet  2  . Ipratropium-Albuterol (COMBIVENT) 20-100 MCG/ACT AERS respimat Inhale 1 puff into the lungs every 6 (six) hours.       No current facility-administered medications for this visit.   No family history on file. History   Social History  . Marital Status: Single    Spouse Name: N/A    Number of Children: N/A  . Years of Education: N/A   Social History Main Topics  . Smoking status: Current Every Day Smoker -- 0.50 packs/day    Types: Cigarettes  . Smokeless tobacco: None   . Alcohol Use: Yes     Comment: daily  . Drug Use: None  . Sexual Activity: None   Other Topics Concern  . None   Social History Narrative  . None   Review of Systems: A 12 point ROS was performed; pertinent positives and negatives were noted in the HPI   Objective:  Physical Exam: Filed Vitals:   12/07/13 0912  BP: 127/79  Pulse: 80  Temp: 97.8 F (36.6 C)  TempSrc: Oral  Height: 5\' 9"  (1.753 m)  Weight: 234 lb 6.4 oz (106.323 kg)  SpO2: 98%   Constitutional: Vital signs reviewed.  Patient is a well-developed and well-nourished male in no acute distress and cooperative with exam.   Head: Normocephalic and atraumatic Eyes: PERRL, EOMI, conjunctivae normal, No scleral icterus.  Cardiovascular: RRR, no MRG, pulses symmetric and intact bilaterally Pulmonary/Chest: normal respiratory effort, CTAB, no wheezes, rales, or rhonchi Abdominal: Soft. Non-tender, non-distended, bowel sounds are normal, no masses, organomegaly, or guarding present.  Musculoskeletal: No joint deformities, erythema. ROM full. Mild TTP of lumbosacral spine. Full ROM of lumbar spine but pain with flexion and extension. Neurological: A&O x3, Strength is normal and symmetric bilaterally, cranial nerve II-XII are grossly intact, no focal motor deficit, sensory intact to light touch bilaterally.  Skin: Warm, dry and intact.  Psychiatric: Normal mood and affect. speech and behavior is normal.   Assessment & Plan:  I discussed the patient with Dr. Kem Kays, and we decided to provide the patient with a list of community resources for him to establish himself with a PCP for management of his chronic medical conditions, especially given his history of substance abuse and possible narcotics seeking behavior.  Please refer to Problem List based Assessment and Plan

## 2013-12-07 NOTE — Assessment & Plan Note (Addendum)
UDS positive on admission for cocaine. Pt states that he has not used cocaine since before his hospital admission. He states that he does not regularly use cocaine but will use during holidays.

## 2013-12-07 NOTE — Assessment & Plan Note (Signed)
Patient history of alcohol abuse, and subsequent chronic/recurrent pancreatitis. He states he's had about 4 episodes of pancreatitis, his most recent one resulted in him being a hospital from 11/30-12/3. Since his hospital discharge he denies any alcohol use, and denies any further abdominal pain, nausea, or vomiting.

## 2013-12-07 NOTE — Assessment & Plan Note (Signed)
Patient denies any further alcohol use since his discharge from the hospital on 12/3. His history of alcohol abuse with chronic alcohol induced pancreatitis. He was given a list of community resources today with continued cessation.

## 2013-12-07 NOTE — Assessment & Plan Note (Signed)
Patient with chronic lumbosacral spinal pain without radiculopathy. He states his previously taking hydrocodone for this. With his UDS positive for cocaine I told him that we would not readily prescribe narcotics in this clinic. He then questioned what could be given to him for his pain if he could not have narcotics. We discussed taking Tylenol, ibuprofen, and naproxen, which he states do not work for him. He also states that he has been given muscle relaxants without relief. With his UDS positive for cocaine I told the patient that a pain specialist will not prescribe him narcotics. I discussed the patient with Dr. Kem Kays, and we decided to provide the patient with a list of community resources for him to establish himself with a PCP.

## 2013-12-07 NOTE — Assessment & Plan Note (Signed)
BP Readings from Last 3 Encounters:  12/07/13 127/79  11/30/13 129/84  11/26/13 132/71    Lab Results  Component Value Date   NA 135 11/29/2013   K 3.9 11/29/2013   CREATININE 1.11 11/29/2013    Assessment: Blood pressure control: controlled Progress toward BP goal:  at goal Comments: His blood pressure looks good today. He did not take his HCTZ this morning, because he is out of the medication. He is requesting a refill today.  Plan: Medications:  continue current medications Educational resources provided: brochure;handout;video Self management tools provided:   Other plans: He is to followup with his new primary care provider for continued management of his blood pressure.

## 2013-12-08 NOTE — Progress Notes (Signed)
Case discussed with Dr. Glenn at time of visit.  We reviewed the resident's history and exam and pertinent patient test results.  I agree with the assessment, diagnosis, and plan of care documented in the resident's note. 

## 2020-07-02 ENCOUNTER — Encounter (HOSPITAL_COMMUNITY): Payer: Self-pay | Admitting: Emergency Medicine

## 2020-07-02 ENCOUNTER — Other Ambulatory Visit: Payer: Self-pay

## 2020-07-02 ENCOUNTER — Ambulatory Visit (HOSPITAL_COMMUNITY)
Admission: EM | Admit: 2020-07-02 | Discharge: 2020-07-02 | Disposition: A | Discharge status: Home / Self Care | Payer: Medicaid Other

## 2020-07-02 DIAGNOSIS — T25219A Burn of second degree of unspecified ankle, initial encounter: Secondary | ICD-10-CM | POA: Diagnosis not present

## 2020-07-02 NOTE — ED Provider Notes (Signed)
[facility]    CSN: [id number] Arrival date & time: 07/02/20  7253      History   Chief Complaint Chief Complaint  Patient presents with  . Foot Burn    HPI Ian Patrick is a 54 y.o. male history of hypertension, tobacco use, presenting today for evaluation of a burn.  Patient accidentally dropped grease on his right ankle last night.  Since has developed blistering to his ankle.  Denies difficulty bending ankle.  Does report some sensitivity especially with walking.  Took some Tylenol last night.  Denies fevers.  HPI  Past Medical History:  Diagnosis Date  . Asthma   . Back pain, chronic   . Hypertension   . Pancreatitis     Patient Active Problem List   Diagnosis Date Noted  . Tobacco abuse 12/07/2013  . Cocaine abuse (HCC) 12/07/2013  . Chronic lower back pain 12/07/2013  . Pancreatitis 11/27/2013  . Asthma, chronic 11/27/2013  . Hypertension 11/27/2013  . Excessive drinking alcohol 11/27/2013    Past Surgical History:  Procedure Laterality Date  . HERNIA REPAIR         Home Medications    Prior to Admission medications   Medication Sig Start Date End Date Taking? Authorizing Provider  budesonide-formoterol (SYMBICORT) 80-4.5 MCG/ACT inhaler Inhale 1 puff into the lungs 2 (two) times daily.    Yes [provider]  hydrochlorothiazide (HYDRODIURIL) 25 MG tablet Take 1 tablet (25 mg total) by mouth daily. 12/07/13  Yes Genelle Gather, MD  Ipratropium-Albuterol (COMBIVENT) 20-100 MCG/ACT AERS respimat Inhale 1 puff into the lungs every 6 (six) hours.    [provider]    Family History History reviewed. No pertinent family history.  Social History Social History   Tobacco Use  . Smoking status: Current Every Day Smoker    Packs/day: 0.50    Types: Cigarettes  Vaping Use  . Vaping Use: Never used  Substance Use Topics  . Alcohol use: Yes    Comment: daily  . Drug use: Yes    Frequency: 1.0 times per week     Types: Cocaine     Allergies   Patient has no known allergies.   Review of Systems Review of Systems  Constitutional: Negative for fatigue and fever.  Eyes: Negative for redness, itching and visual disturbance.  Respiratory: Negative for shortness of breath.   Cardiovascular: Negative for chest pain and leg swelling.  Gastrointestinal: Negative for nausea and vomiting.  Musculoskeletal: Negative for arthralgias and myalgias.  Skin: Positive for wound. Negative for color change and rash.  Neurological: Negative for dizziness, syncope, weakness, light-headedness and headaches.     Physical Exam Triage Vital Signs ED Triage Vitals [07/02/20 0844]  Enc Vitals Group     BP      Pulse      Resp      Temp      Temp src      SpO2      Weight      Height      Head Circumference      Peak Flow      Pain Score 3     Pain Loc      Pain Edu?      Excl. in GC?    No data found.  Updated Vital Signs BP (!) 161/98 (BP Location: Right Arm)   Pulse 61   Temp 97.9 F (36.6 C) (Oral)   Resp 16   SpO2 96%  Visual Acuity Right Eye Distance:   Left Eye Distance:   Bilateral Distance:    Right Eye Near:   Left Eye Near:    Bilateral Near:     Physical Exam Vitals and nursing note reviewed.  Constitutional:      Appearance: He is well-developed.     Comments: No acute distress  HENT:     Head: Normocephalic and atraumatic.     Nose: Nose normal.  Eyes:     Conjunctiva/sclera: Conjunctivae normal.  Cardiovascular:     Rate and Rhythm: Normal rate.  Pulmonary:     Effort: Pulmonary effort is normal. No respiratory distress.  Abdominal:     General: There is no distension.  Musculoskeletal:        General: Normal range of motion.     Cervical back: Neck supple.  Skin:    General: Skin is warm and dry.     Comments: Right medial aspect of ankle with 2 bulla, 1 measuring approximately 4 cm x 3 cm, smaller more superior with 1 cm x 2 cm lesion Drained clear yellow  fluid  Neurological:     Mental Status: He is alert and oriented to person, place, and time.      UC Treatments / Results  Labs (all labs ordered are listed, but only abnormal results are displayed) Labs Reviewed - No data to display  EKG   Radiology No results found.  Procedures Procedures (including critical care time)  Medications Ordered in UC Medications - No data to display  Initial Impression / Assessment and Plan / UC Course  I have reviewed the triage vital signs and the nursing notes.  Pertinent labs & imaging results that were available during my care of the patient were reviewed by me and considered in my medical decision making (see chart for details).    Partial-thickness burn to right ankle. Opted to go ahead and drain blisters for comfort given size, discussed wound care, monitor for signs of infection.  Discussed strict return precautions. Patient verbalized understanding and is agreeable with plan.  Final Clinical Impressions(s) / UC Diagnoses   Final diagnoses:  Partial thickness burn of ankle, initial encounter     Discharge Instructions     Please use Tylenol [phone] mg every 4-6 hours, may supplement with Ibuprofen 600 mg every 8 hours May apply aloe vera for further relief/healing; may use burn/cream with zinc in it for healing May apply wet gauze to keep cool Do not apply ice Follow up for any concerns about area healing  For any further burns run cool water over burn for 20 min immediately after    ED Prescriptions    None     PDMP not reviewed this encounter.   Lew Dawes, [place] 07/02/20 [phone]

## 2020-07-02 NOTE — ED Triage Notes (Signed)
Pt was splashed by cooking grease around approx 7pm last night. 2 blisters noted to right anterior ankle. Stinging pain with walking. Pt took 1000mg  of Tylenol at approx 9pm last night.

## 2020-07-02 NOTE — Discharge Instructions (Signed)
Please use Tylenol [phone] mg every 4-6 hours, may supplement with Ibuprofen 600 mg every 8 hours May apply aloe vera for further relief/healing; may use burn/cream with zinc in it for healing May apply wet gauze to keep cool Do not apply ice Follow up for any concerns about area healing  For any further burns run cool water over burn for 20 min immediately after

## 2021-01-24 ENCOUNTER — Ambulatory Visit: Payer: Medicaid Other | Admitting: Medical

## 2021-10-02 ENCOUNTER — Emergency Department (HOSPITAL_COMMUNITY): Payer: BC Managed Care – PPO

## 2021-10-02 ENCOUNTER — Other Ambulatory Visit: Payer: Self-pay

## 2021-10-02 ENCOUNTER — Emergency Department (HOSPITAL_COMMUNITY)
Admission: EM | Admit: 2021-10-02 | Discharge: 2021-10-02 | Disposition: A | Discharge status: Home / Self Care | Payer: BC Managed Care – PPO | Attending: Emergency Medicine | Admitting: Emergency Medicine

## 2021-10-02 ENCOUNTER — Encounter (HOSPITAL_COMMUNITY): Payer: Self-pay

## 2021-10-02 DIAGNOSIS — Z7951 Long term (current) use of inhaled steroids: Secondary | ICD-10-CM | POA: Insufficient documentation

## 2021-10-02 DIAGNOSIS — Z20822 Contact with and (suspected) exposure to covid-19: Secondary | ICD-10-CM | POA: Diagnosis not present

## 2021-10-02 DIAGNOSIS — I10 Essential (primary) hypertension: Secondary | ICD-10-CM | POA: Insufficient documentation

## 2021-10-02 DIAGNOSIS — J45909 Unspecified asthma, uncomplicated: Secondary | ICD-10-CM | POA: Diagnosis not present

## 2021-10-02 DIAGNOSIS — F17210 Nicotine dependence, cigarettes, uncomplicated: Secondary | ICD-10-CM | POA: Insufficient documentation

## 2021-10-02 DIAGNOSIS — Z79899 Other long term (current) drug therapy: Secondary | ICD-10-CM | POA: Insufficient documentation

## 2021-10-02 DIAGNOSIS — R110 Nausea: Secondary | ICD-10-CM | POA: Diagnosis not present

## 2021-10-02 DIAGNOSIS — R1013 Epigastric pain: Secondary | ICD-10-CM

## 2021-10-02 LAB — COMPREHENSIVE METABOLIC PANEL
ALT: 12 U/L (ref 0–44)
AST: 22 U/L (ref 15–41)
Albumin: 4.3 g/dL (ref 3.5–5.0)
Alkaline Phosphatase: 64 U/L (ref 38–126)
Anion gap: 9 (ref 5–15)
BUN: 11 mg/dL (ref 6–20)
CO2: 29 mmol/L (ref 22–32)
Calcium: 9.3 mg/dL (ref 8.9–10.3)
Chloride: 100 mmol/L (ref 98–111)
Creatinine, Ser: 1.13 mg/dL (ref 0.61–1.24)
GFR, Estimated: >60 mL/min (ref >60)
Glucose, Bld: 131 mg/dL — ABNORMAL HIGH (ref 70–99)
Potassium: 4.2 mmol/L (ref 3.5–5.1)
Sodium: 138 mmol/L (ref 135–145)
Total Bilirubin: 1.2 mg/dL (ref 0.3–1.2)
Total Protein: 7.8 g/dL (ref 6.5–8.1)

## 2021-10-02 LAB — CBC WITH DIFFERENTIAL/PLATELET
Abs Immature Granulocytes: 0.06 10*3/uL (ref 0.00–0.07)
Basophils Absolute: 0 10*3/uL (ref 0.0–0.1)
Basophils Relative: 0 %
Eosinophils Absolute: 0.2 10*3/uL (ref 0.0–0.5)
Eosinophils Relative: 3 %
HCT: 50.2 % (ref 39.0–52.0)
Hemoglobin: 17.2 g/dL — ABNORMAL HIGH (ref 13.0–17.0)
Immature Granulocytes: 1 %
Lymphocytes Relative: 33 %
Lymphs Abs: 1.7 10*3/uL (ref 0.7–4.0)
MCH: 39 pg — ABNORMAL HIGH (ref 26.0–34.0)
MCHC: 34.3 g/dL (ref 30.0–36.0)
MCV: 113.8 fL — ABNORMAL HIGH (ref 80.0–100.0)
Monocytes Absolute: 0.7 10*3/uL (ref 0.1–1.0)
Monocytes Relative: 15 %
Neutro Abs: 2.4 10*3/uL (ref 1.7–7.7)
Neutrophils Relative %: 48 %
Platelets: 242 10*3/uL (ref 150–400)
RBC: 4.41 MIL/uL (ref 4.22–5.81)
RDW: 12.4 % (ref 11.5–15.5)
WBC: 5 10*3/uL (ref 4.0–10.5)
nRBC: 0 % (ref 0.0–0.2)

## 2021-10-02 LAB — RESP PANEL BY RT-PCR (FLU A&B, COVID) ARPGX2
Influenza A by PCR: NEGATIVE
Influenza B by PCR: NEGATIVE
SARS Coronavirus 2 by RT PCR: NEGATIVE

## 2021-10-02 LAB — URINALYSIS, ROUTINE W REFLEX MICROSCOPIC
Bilirubin Urine: NEGATIVE
Glucose, UA: NEGATIVE mg/dL
Hgb urine dipstick: NEGATIVE
Ketones, ur: NEGATIVE mg/dL
Leukocytes,Ua: NEGATIVE
Nitrite: NEGATIVE
Protein, ur: NEGATIVE mg/dL
Specific Gravity, Urine: 1.01 (ref 1.005–1.030)
pH: 8 (ref 5.0–8.0)

## 2021-10-02 LAB — LIPASE, BLOOD: Lipase: 41 U/L (ref 11–51)

## 2021-10-02 MED ORDER — ONDANSETRON HCL 4 MG/2ML IJ SOLN
4.0000 mg | Freq: Once | INTRAMUSCULAR | Status: AC
  Administered 2021-10-02: 4 mg via INTRAVENOUS
  Filled 2021-10-02: qty 2

## 2021-10-02 MED ORDER — ONDANSETRON 4 MG PO TBDP: 4.0000 mg | ORAL_TABLET | Freq: Three times a day (TID) | ORAL | 0 refills | Status: AC | PRN

## 2021-10-02 MED ORDER — IOHEXOL 350 MG/ML SOLN
80.0000 mL | Freq: Once | INTRAVENOUS | Status: AC | PRN
  Administered 2021-10-02: 80 mL via INTRAVENOUS

## 2021-10-02 MED ORDER — HYDROMORPHONE HCL 1 MG/ML IJ SOLN
1.0000 mg | Freq: Once | INTRAMUSCULAR | Status: AC
  Administered 2021-10-02: 1 mg via INTRAVENOUS
  Filled 2021-10-02: qty 1

## 2021-10-02 MED ORDER — SODIUM CHLORIDE (PF) 0.9 % IJ SOLN
INTRAMUSCULAR | Status: AC
  Filled 2021-10-02: qty 50

## 2021-10-02 MED ORDER — FAMOTIDINE 20 MG PO TABS: 20.0000 mg | ORAL_TABLET | Freq: Two times a day (BID) | ORAL | 0 refills | Status: AC

## 2021-10-02 MED ORDER — OXYCODONE-ACETAMINOPHEN 5-325 MG PO TABS: 1.0000 | ORAL_TABLET | Freq: Four times a day (QID) | ORAL | 0 refills | Status: AC | PRN

## 2021-10-02 MED ORDER — SODIUM CHLORIDE 0.9 % IV BOLUS
1000.0000 mL | Freq: Once | INTRAVENOUS | Status: AC
  Administered 2021-10-02: 1000 mL via INTRAVENOUS

## 2021-10-02 NOTE — ED Provider Notes (Signed)
[facility]-EMERGENCY DEPT Provider Note   CSN: [id number] Arrival date & time: 10/02/21  8127     History Chief Complaint  Patient presents with   Abdominal Pain   Pancreatitis    Ian Patrick is a 55 y.o. male.  Patient presents to the emergency department with a chief complaint of epigastric abdominal pain.  He states that this feels similar to prior episodes of pancreatitis.  He states that the symptoms started 2 hours ago.  He reports drinking alcohol last night.  States he only had 1 drink.  Denies any fevers or chills.  He reports feeling nauseated, but has not had any vomiting.  He denies any other associated symptoms.  The history is provided by the patient. No language interpreter was used.      Past Medical History:  Diagnosis Date   Asthma    Back pain, chronic    Hypertension    Pancreatitis     Patient Active Problem List   Diagnosis Date Noted   Tobacco abuse 12/07/2013   Cocaine abuse (HCC) 12/07/2013   Chronic lower back pain 12/07/2013   Pancreatitis 11/27/2013   Asthma, chronic 11/27/2013   Hypertension 11/27/2013   Excessive drinking alcohol 11/27/2013    Past Surgical History:  Procedure Laterality Date   HERNIA REPAIR         No family history on file.  Social History   Tobacco Use   Smoking status: Every Day    Packs/day: 0.50    Types: Cigarettes  Vaping Use   Vaping Use: Never used  Substance Use Topics   Alcohol use: Yes    Comment: daily   Drug use: Yes    Frequency: 1.0 times per week    Types: Cocaine    Home Medications Prior to Admission medications   Medication Sig Start Date End Date Taking? Authorizing Provider  budesonide-formoterol (SYMBICORT) 80-4.5 MCG/ACT inhaler Inhale 1 puff into the lungs 2 (two) times daily.     [provider]  hydrochlorothiazide (HYDRODIURIL) 25 MG tablet Take 1 tablet (25 mg total) by mouth daily. 12/07/13   Genelle Gather, MD  Ipratropium-Albuterol  (COMBIVENT) 20-100 MCG/ACT AERS respimat Inhale 1 puff into the lungs every 6 (six) hours.    [provider]    Allergies    Patient has no known allergies.  Review of Systems   Review of Systems  All other systems reviewed and are negative.  Physical Exam Updated Vital Signs BP (!) 166/135 (BP Location: Left Arm)   Pulse 81   Temp 97.6 F (36.4 C) (Oral)   Resp 18   SpO2 99%   Physical Exam Vitals and nursing note reviewed.  Constitutional:      Appearance: He is well-developed.  HENT:     Head: Normocephalic and atraumatic.  Eyes:     Conjunctiva/sclera: Conjunctivae normal.  Cardiovascular:     Rate and Rhythm: Normal rate and regular rhythm.     Heart sounds: No murmur heard. Pulmonary:     Effort: Pulmonary effort is normal. No respiratory distress.     Breath sounds: Normal breath sounds.  Abdominal:     Palpations: Abdomen is soft.     Tenderness: There is abdominal tenderness.  Musculoskeletal:        General: Normal range of motion.     Cervical back: Neck supple.  Skin:    General: Skin is warm and dry.  Neurological:     Mental Status: He is  alert and oriented to person, place, and time.  Psychiatric:        Mood and Affect: Mood normal.        Behavior: Behavior normal.    ED Results / Procedures / Treatments   Labs (all labs ordered are listed, but only abnormal results are displayed) Labs Reviewed  RESP PANEL BY RT-PCR (FLU A&B, COVID) ARPGX2  CBC WITH DIFFERENTIAL/PLATELET  COMPREHENSIVE METABOLIC PANEL  LIPASE, BLOOD  URINALYSIS, ROUTINE W REFLEX MICROSCOPIC    EKG None  Radiology No results found.  Procedures Procedures   Medications Ordered in ED Medications  sodium chloride 0.9 % bolus 1,000 mL (has no administration in time range)  HYDROmorphone (DILAUDID) injection 1 mg (has no administration in time range)  ondansetron (ZOFRAN) injection 4 mg (has no administration in time range)    ED Course  I have reviewed  the triage vital signs and the nursing notes.  Pertinent labs & imaging results that were available during my care of the patient were reviewed by me and considered in my medical decision making (see chart for details).    MDM Rules/Calculators/A&P                           Patient presenting with epigastric abdominal pain.  He states that this feels similar to prior pancreatitis.  States that his symptoms started after drinking alcohol tonight, but he states that he only had 1 drink.  He reports feeling nauseated, but not having any vomiting.  Laboratory work-up is reassuring.  No leukocytosis.  Lipase was normal at 41.  Given the amount of pain, will check CT.  CT shows no evidence of pancreatitis.  I wonder if his symptoms were alcohol gastritis.  He is feeling improved after pain and nausea medicine.  He is not having any vomiting.  I feel that the patient is stable for discharge.  Return precautions discussed.  Will discharge home with some pain medicine, Zofran, and Pepcid.  Patient is agreeable with plan for discharge. Final Clinical Impression(s) / ED Diagnoses Final diagnoses:  Epigastric pain    Rx / DC Orders ED Discharge Orders     None        Roxy Horseman, PA-C 10/02/21 0622    Sabas Sous, MD 10/02/21 [phone]

## 2021-10-02 NOTE — Discharge Instructions (Signed)
Your CT scan showed now evidence of pancreatitis.  Your blood work also looked good.  This could have been gastritis, or inflammation of the stomach.    Please avoid alcohol.   Take medications as directed.

## 2021-10-02 NOTE — ED Triage Notes (Signed)
Pt complains of RUQ pain x 2 hrs. Pt states that it feels like he has pancreatitis.

## 2021-10-02 NOTE — ED Notes (Signed)
Labeled specimen cup provided to pt for urine collection per MD order. ENMiles 

## 2022-01-03 ENCOUNTER — Ambulatory Visit: Admit: 2022-01-03 | Discharge: 2022-01-03 | Payer: MEDICAID | Attending: Family | Primary: Family

## 2022-01-03 DIAGNOSIS — I10 Essential (primary) hypertension: Secondary | ICD-10-CM

## 2022-01-03 MED ORDER — HYDROCHLOROTHIAZIDE 25 MG TAB
25 mg | ORAL_TABLET | Freq: Every day | ORAL | 0 refills | Status: AC
Start: 2022-01-03 — End: ?

## 2022-01-03 MED ORDER — AMLODIPINE 5 MG TAB
5 mg | ORAL_TABLET | Freq: Every day | ORAL | 0 refills | Status: AC
Start: 2022-01-03 — End: ?

## 2022-01-03 NOTE — Assessment & Plan Note (Signed)
Persists, consult ortho for further eval

## 2022-01-03 NOTE — Progress Notes (Signed)
Progress Notes by Raeford RazorPagtakhan, Rainier Feuerborn, NP at 01/03/22 1530                Author: Raeford RazorPagtakhan, Lariza Cothron, NP  Service: --  Author Type: Nurse Practitioner       Filed: 01/03/22 1645  Encounter Date: 01/03/2022  Status: Signed          Editor: Raeford RazorPagtakhan, Georgiann Neider, NP (Nurse Practitioner)               Justin Simmons is a 56 y.o. male is seen on 01/03/2022 for Establish Care (Lower back pain due to working. ), Hypertension, and Sexual Problem (Over a year having trouble performing.  He states that half the time he don't have a desire for intercourse. )        Assessment & Plan:        1. Uncontrolled hypertension   Assessment & Plan:   BP elevated, asymptomatic, goal <130/80   Restart amlodipine, HCTZ, given 30-days supply until labs completed   Advised on home BP monitoring and discussed proper way to check BP at home   Given BP log to bring to next appointment   If BP remains elevated, will increase amlodipine to 10 mg   Orders:   -     METABOLIC PANEL, COMPREHENSIVE; Future   -     CBC WITH AUTOMATED DIFF; Future   -     TSH 3RD GENERATION; Future   -     amLODIPine (NORVASC) 5 mg tablet; Take 1 Tablet by mouth daily., Normal, Disp-30 Tablet, R-0NEEDS APPT AND LABS FOR FURTHER REFILLS   -     hydroCHLOROthiazide (HYDRODIURIL) 25 mg tablet; Take 1 Tablet by mouth daily., Normal, Disp-30 Tablet, R-0NEEDS APPT AND LABS FOR FURTHER REFILLS   2. Chronic bilateral low back pain without sciatica   Assessment & Plan:   Persists, consult ortho for further eval   Orders:   -     REFERRAL TO PHYSICIAL MEDICINE REHAB   3. Paresthesia   Comments:   Send to hand specialist for further eval   Orders:   -     REFERRAL TO HAND SURGERY   4. Low libido   -     REFERRAL TO UROLOGY   -     METABOLIC PANEL, COMPREHENSIVE; Future   -     CBC WITH AUTOMATED DIFF; Future   5. Erectile dysfunction, unspecified erectile dysfunction type   -     REFERRAL TO UROLOGY   -     METABOLIC PANEL, COMPREHENSIVE; Future   -     CBC WITH AUTOMATED DIFF;  Future   6. Need for hepatitis C screening test   -     HEPATITIS C AB; Future   7. Screening for lipid disorders   -     LIPID PANEL; Future   8. Screen for colon cancer   Comments:   Discussed colonoscopy vs FIT vs Cologuard   Opted for colonoscopy, referred   Orders:   -     REFERRAL TO GASTROENTEROLOGY   9. Prostate cancer screening   Comments:   Discussed risk vs benefits of testing   Orders:   -     PSA SCREENING (SCREENING); Future   10. Encounter to establish care        Follow-up and Dispositions      ??  Return in about 4 weeks (around 01/31/2022) for physical, blood pressure, lab results.  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   Alcohol Use- 2-3 drinks per day   Recreational Drug Use- none   Tobacco Use- current smoker      Nicotine Dependence-   Years of smoking: over 30 years   Amount:  0.5 ppd   Previous attempt to quit: Yes   Past medications used for cessation: nicotine patch   Current symptoms: some wheezing      Family Hx:   HTN- mother   Diabetes- none   HLD- none   MI- none   Stroke- none   Cancer- none   Mental Health Disorder- none   Autoimmune Disease -  none      Preventive:   COVID-19 vac -  received 2 doses   Flu vaccine- declined   Tetanus vac - thinks he has had this in [place]   Pneumonia vaccine- will give at next appointment   Shingles vaccine- recommended   Colonoscopy- agrees to FIT   PSA - ordered   Hep C screening - ordered   MyChart activation -  sent via text today      Medical History/Health Concerns:      Hypertension-   Symptoms:  None   BP readings at home are 170s systolic   Comorbid: obesity   Current treatment: amlodipine 5 mg, HCTZ 25 mg, ran out a year ago      Asthma -   Went to UC yesterday for wheezing   CXR was done and told he has pneumonia   Was started on Doxycycline for 10 days   Breathing is better      Erectile Dysfunction -   Onset:  over 1  year ago   Reports low libido   Risk factors: HTN, smoker      Lower Back Pain -   5-10 years   Had xray in the past, was told he had spinal stenosis for L4-5   No hx of back surgery   Was on hydrocodone in the past   Location:  bilateral   Aggravated by bending   Relieved by rest   Denies any shooting pain down his leg   Does report right foot numbness   He would like to see a back specialist      Paresthesia -   Onset:  3-4 years   Intermittent   Location:  right hand   Aggravated by driving and laying on affected side   Denies any neck pain      Review of Systems    Constitutional:  Negative for chills, fever and malaise/fatigue.    Respiratory:  Positive for wheezing. Negative for shortness of breath.     Cardiovascular:  Negative for chest pain.    Musculoskeletal:  Positive for back pain.    Neurological:          Reports numbness of right hand         Objective:     BP (!) 171/103 (BP 1 Location: Left upper arm, BP Patient Position: Sitting, BP Cuff Size: Adult)    Pulse 78    Temp 97.9 ??F (36.6 ??C) (Oral)    Resp 16    Ht 5\' 8"  (1.727  m)    Wt 230 lb (104.3 kg)    SpO2  99%    BMI 34.97 kg/m??       Physical Exam   Vitals and nursing note reviewed.    Constitutional:        Appearance: Normal appearance.    HENT:       Head: Normocephalic and atraumatic.    Cardiovascular:       Rate and Rhythm: Normal rate and regular rhythm.       Heart sounds: No murmur heard.     No gallop.    Pulmonary:       Effort: Pulmonary effort is normal. No respiratory distress.       Breath sounds: No wheezing, rhonchi or rales.     Musculoskeletal:       Lumbar back: Tenderness present. No swelling or deformity. Decreased range of motion . Positive right straight leg raise test. Negative left straight leg raise test.         Back:        Skin:      General: Skin is warm and dry.    Neurological:       General: No focal deficit present.       Mental Status: He is alert and oriented to person, place, and time.    Psychiatric:           Mood and Affect: Mood normal.          Thought Content: Thought content normal.          Judgment: Judgment normal.              Raeford Razor, NP

## 2022-01-03 NOTE — Assessment & Plan Note (Signed)
BP elevated, asymptomatic, goal <130/80  Restart amlodipine, HCTZ, given 30-days supply until labs completed  Advised on home BP monitoring and discussed proper way to check BP at home  Given BP log to bring to next appointment  If BP remains elevated, will increase amlodipine to 10 mg

## 2022-01-03 NOTE — Progress Notes (Signed)
Progress  Notes by Noelle Penner at 01/03/22 1530                Author: Noelle Penner  Service: --  Author Type: Medical Assistant       Filed: 01/03/22 1645  Encounter Date: 01/03/2022  Status: Signed          Editor: Noelle Penner (Medical Assistant)                  Aimar Brannock presents today for      Chief Complaint       Patient presents with        ?  Establish Care             Lower back pain due to working.         ?  Hypertension     ?  Sexual Problem             Over a year having trouble performing. He states that half the time he don't have a desire for intercourse.            Is someone accompanying this pt? No       Is the patient using any DME equipment during OV? No       Depression Screening:      3 most recent PHQ Screens  01/03/2022        Little interest or pleasure in doing things  Not at all     Feeling down, depressed, irritable, or hopeless  Not at all        Total Score PHQ 2  0           Learning Assessment:      Learning Assessment  01/03/2022        PRIMARY LEARNER  Patient     HIGHEST LEVEL OF EDUCATION - PRIMARY LEARNER   2 YEARS OF COLLEGE     BARRIERS PRIMARY LEARNER  NONE     CO-LEARNER CAREGIVER  No     PRIMARY LANGUAGE  ENGLISH     LEARNER PREFERENCE PRIMARY  DEMONSTRATION     ANSWERED BY  patient        RELATIONSHIP  SELF           Fall Risk   No flowsheet data found.      ADL   No flowsheet data found.      Travel Screening:         Travel Screening               Question  Response           In the last 10 days, have you been in contact with someone who was confirmed or suspected to have Coronavirus/COVID-19?  No / Unsure       Have you had a COVID-19 viral test in the last 10 days?  No       Do you have any of the following new or worsening symptoms?  None of these           Have you traveled internationally or domestically in the last month?  No                     Travel History    Travel since 12/03/21      No documented travel since 12/03/21                 Health Maintenance reviewed and  discussed and ordered per Provider.        Health Maintenance Due        Topic  Date Due         ?  Hepatitis C Screening   Never done     ?  Depression Screen   Never done     ?  COVID-19 Vaccine (1)  Never done     ?  DTaP/Tdap/Td series (1 - Tdap)  Never done     ?  Lipid Screen   Never done     ?  Colorectal Cancer Screening Combo   Never done     ?  Shingles Vaccine (1 of 2)  Never done         ?  Flu Vaccine (1)  Never done     .        Coordination of Care:   1. Have you been to the ER, urgent care clinic since your last visit? Hospitalized since your last visit? no      2. Have you seen or consulted any other health care providers outside of the [facility] since your last visit? Include any pap smears or colon screening.  No

## 2022-01-29 ENCOUNTER — Encounter: Attending: Orthopaedic Surgery | Primary: Family

## 2022-01-29 NOTE — Assessment & Plan Note (Signed)
Physical activity for a total of 150 minutes per week is recommended, drink plenty of water.  Reduce CHO intake, such as white pastas, white rice, white breads. Avoid fried foods, and eat more green, leafy vegetables, whole grains, and lean proteins.  Discussed recommended screenings and vaccines.  Advised to complete fasting labs

## 2022-01-31 ENCOUNTER — Encounter: Attending: Family | Primary: Family

## 2022-02-11 ENCOUNTER — Encounter

## 2022-02-13 ENCOUNTER — Encounter: Attending: Family | Primary: Family

## 2022-02-18 NOTE — Assessment & Plan Note (Deleted)
Physical activity for a total of 150 minutes per week is recommended, drink plenty of water.  Reduce CHO intake, such as white pastas, white rice, white breads. Avoid fried foods, and eat more green, leafy vegetables, whole grains, and lean proteins.  Discussed recommended screenings and vaccines.  Advised to complete fasting labs

## 2022-02-18 NOTE — Telephone Encounter (Signed)
3rd attempt: lmg to schedule appointment. [facility] referral has been closed.    Schedule w/Alcantara at [facility] office ONLY! As of today, he is not credentialed at [facility] office or schedule w/Dr Jinger Neighbors at [facility].    Chronic bilateral low back pain/no imaging/? Prev sx/ref by NP Raeford Razor

## 2022-02-19 ENCOUNTER — Encounter: Payer: PRIVATE HEALTH INSURANCE | Attending: Family | Primary: Family

## 2022-02-19 DIAGNOSIS — Z0000 Encounter for general adult medical examination without abnormal findings: Secondary | ICD-10-CM

## 2022-02-19 NOTE — Progress Notes (Deleted)
Justin Simmons is a 56 y.o. male is seen on 02/19/2022 for No chief complaint on file.    Assessment & Plan:     1. Annual physical exam  Assessment & Plan:  Physical activity for a total of 150 minutes per week is recommended, drink plenty of water.  Reduce CHO intake, such as white pastas, white rice, white breads. Avoid fried foods, and eat more green, leafy vegetables, whole grains, and lean proteins.  Discussed recommended screenings and vaccines.  Advised to complete fasting labs  2. 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   Alcohol Use- 2-3 drinks per day   Recreational Drug Use- none   Tobacco Use- current smoker      Nicotine Dependence-   Years of smoking: over 30 years   Amount:  0.5 ppd   Previous attempt to quit: Yes   Past medications used for cessation: nicotine patch   Current symptoms: some wheezing      Family Hx:   HTN- mother   Diabetes- none   HLD- none   MI- none   Stroke- none   Cancer- none   Mental Health Disorder- none   Autoimmune Disease -  none    Preventive:  Diet:  Exercise:  Last eye exam-  Last dental exam-  COVID-19 vac -  received 2 doses  Tetanus vac - thinks he has had this in [place]  Pneumonia vaccine- will give at next appointment  Shingles vaccine- recommended  Colonoscopy- agrees to FIT  PSA - ordered  Hep C screening - ordered  MyChart activation -  sent via text today    Additional Health Concerns Addressed Today:          Review of Systems   Constitutional: Negative.    Respiratory:  Negative for shortness of breath.    Cardiovascular:  Negative for chest pain, palpitations and leg swelling.   Gastrointestinal:  Negative for abdominal distention, abdominal pain, blood in stool, constipation, diarrhea and vomiting.   Endocrine: Negative for polyuria.   Genitourinary:  Negative for dysuria, frequency, hematuria, testicular pain and urgency.   Musculoskeletal:  Negative for arthralgias.   Skin:   Negative for rash.   Neurological:  Negative for dizziness, light-headedness and headaches.   Psychiatric/Behavioral:  Negative for behavioral problems.      Objective:   There were no vitals taken for this visit.    No results found.      Physical Exam  Vitals and nursing note reviewed.   Constitutional:       General: He is not in acute distress.     Appearance: He is not ill-appearing.   HENT:      Head: Normocephalic and atraumatic.      Right Ear: Tympanic membrane, ear canal and external ear normal.      Left Ear: Tympanic membrane, ear canal and external ear normal.      Mouth/Throat:      Mouth: Mucous membranes are moist.      Pharynx: Oropharynx is clear. No oropharyngeal exudate or posterior oropharyngeal erythema.   Eyes:      Extraocular Movements: Extraocular movements intact.      Pupils: Pupils are equal, round, and reactive to light.   Cardiovascular:      Rate and Rhythm: Normal rate and regular rhythm.   Pulmonary:  Effort: Pulmonary effort is normal. No respiratory distress.      Breath sounds: No wheezing, rhonchi or rales.   Abdominal:      General: There is no distension.      Palpations: Abdomen is soft. There is no mass.      Tenderness: There is no abdominal tenderness. There is no right CVA tenderness, left CVA tenderness, guarding or rebound.   Musculoskeletal:         General: Normal range of motion.      Cervical back: Normal range of motion and neck supple.   Lymphadenopathy:      Cervical: No cervical adenopathy.   Skin:     General: Skin is warm and dry.   Neurological:      General: No focal deficit present.      Mental Status: He is alert and oriented to person, place, and time.   Psychiatric:         Mood and Affect: Mood normal.         Thought Content: Thought content normal.         Judgment: Judgment normal.           Raeford Razor, FNP-C

## 2022-03-25 ENCOUNTER — Ambulatory Visit: Admit: 2022-03-25 | Discharge: 2022-03-25 | Payer: PRIVATE HEALTH INSURANCE | Primary: Family

## 2022-03-25 ENCOUNTER — Ambulatory Visit
Admit: 2022-03-25 | Discharge: 2022-03-25 | Payer: PRIVATE HEALTH INSURANCE | Attending: Physical Medicine & Rehabilitation | Primary: Family

## 2022-03-25 DIAGNOSIS — M5416 Radiculopathy, lumbar region: Secondary | ICD-10-CM

## 2022-03-25 MED ORDER — CYCLOBENZAPRINE HCL 10 MG PO TABS
10 MG | ORAL_TABLET | Freq: Every evening | ORAL | 0 refills | Status: AC | PRN
Start: 2022-03-25 — End: 2022-04-24

## 2022-03-25 NOTE — Progress Notes (Signed)
[facility]  [address]  [place], [place] [address]  Phone: [phone]  Fax: [phone]      Patient: Justin Simmons                                                                              MRN: [id number]        Date of Birth: 1966/08/03          AGE: 56 y.o.             PCP: Raeford Razor, NP-C  Date:  03/25/22    Reason for Consultation: Lower Back Pain      HPI:  Justin Simmons is a 56 y.o. male with relevant PMH of HTN who presents with low back pain with intermittent radiation down the right leg.  The pain began 10 years prior. Denies any precipitating incident or trauma.       Neurologic symptoms: + numbness, tingling. No weakness, bowel or bladder changes.  No recent falls      Location: The pain is located in the low back pain  80%  Radiation: The pain does radiate down the right leg anteriorly.    Pain Score: Currently: 6/10   Quality: Pain is of a aching, throbbing quality.    Aggravating: Pain is exacerbated by walking, sitting, standing, lying down, and exercise  Alleviating: The pain is alleviated by nothing    Prior Treatments:  Physical therapy: No  Injections:ESI many years ago   Surgery:No  Previous Medications: hydrocodone, lyrica, gabapentin- no relief   Current Medications:  Previous work-up has included:     Past Medical History:   Past Medical History:   Diagnosis Date    Asthma     Hypertension       Past Surgical History:   Past Surgical History:   Procedure Laterality Date    HERNIA REPAIR        SocHx:   Social History     Tobacco Use    Smoking status: Every Day     Packs/day: 0.50     Types: Cigarettes    Smokeless tobacco: Never   Substance Use Topics    Alcohol use: Yes     Alcohol/week: 1.0 - 2.0 standard drink     Comment: Daily      FamHx:? No family history on file.    Current Medications:   Current Outpatient Medications   Medication Sig Dispense Refill    albuterol (PROVENTIL) (2.5 MG/3ML) 0.083% nebulizer solution  Inhale into the lungs      amLODIPine (NORVASC) 5 MG tablet Take 5 mg by mouth daily      hydroCHLOROthiazide (HYDRODIURIL) 25 MG tablet Take 25 mg by mouth daily       No current facility-administered medications for this visit.      Allergies:  No Known Allergies          Physical Exam     Vital Signs: Ht 5\' 8"  (1.727 m)    BMI 34.97 kg/m??    General: ???????  male Body mass index is 34.97 kg/m??. without any acute distress   Psychiatric: ?  Alert and oriented x  3 with normal mood    HEENT: ????????  Atraumatic   Respiratory:   Breathing non-labored and non dyspneic   CV: ???????????????? Peripheral pulses intact, no peripheral edema   Skin: ?????????????  No rashes       Neurologic: ??      Sensation: normal and grossly intact thebilateral, lower extremity(s)    Strength: 5/5 in the bilateral, lower extremity(s) except weakness right DF 3/5, EHL 3/5, plantar flexion 4/5    Reflexes: reveals 2+ symmetric DTRs throughout except absent bilateral achilles   Gait: normal     Musculoskeletal: Lumbar Exam     Inspection:   Alignment: Normal  Atrophy: None       Tenderness to Palpation:   Lumbar paraspinals Positive  Lumbar spinous processes Negative  SI Joint:  Negative  Gluteal:Positive   Greater trochanter: Negative      ROM:   Lumbar ROM: Abnormal pain worse with flexion  Lumbar facet loading: Negative  Hip ROM: No reproduction of pain with movement     Special Tests      Slump test: Positive right  SLR: Positive right   FABER: Negative  FADIR: Negative  Log Roll: Negative    ?     Medical Decision Making:    Images: The imaging results as well as the actual images of the studies below were reviewed, visualized and interpreted by me.     Labs:  The results below were reviewed.        Assessment:   Right lumbar radiculopathy with weakness right foot    Plan:      -Physical therapy -  referral to pt  -Medications - flexeril 10mg  qhs prn for spasm. Counseled regarding side effects and appropriate administration of  medications.    -Diagnostics/Imaging - x-ray lumbar spine   -Injections - Consider ESI   -Lifestyle - Recommend weight loss regular exercise   -Education - The patient's diagnosis, prognosis and treatment options were discussed today. All questions were answered.    F/U - in 8 week(s) or sooner if needed.          MD  [facility]

## 2022-04-10 ENCOUNTER — Encounter

## 2022-04-10 NOTE — Telephone Encounter (Signed)
Last appt 01/03/22  Next appt none  Last lab none    Pt called, no answer. Left a VM for pt to make an appt. No inhaler listed, put in for ProAir.

## 2022-04-10 NOTE — Telephone Encounter (Signed)
Patient would like a albuterol inhaler he said that [facility] sent over the solution for the nebulizer. He would like the prescription sent to [facility]. Please advise

## 2022-04-11 MED ORDER — ALBUTEROL SULFATE HFA 108 (90 BASE) MCG/ACT IN AERS
108 (90 Base) MCG/ACT | Freq: Four times a day (QID) | RESPIRATORY_TRACT | 3 refills | Status: AC | PRN
Start: 2022-04-11 — End: ?

## 2022-05-06 ENCOUNTER — Encounter
Admit: 2022-05-06 | Discharge: 2022-05-06 | Payer: PRIVATE HEALTH INSURANCE | Attending: Physical Medicine & Rehabilitation | Primary: Family

## 2022-05-06 DIAGNOSIS — M5416 Radiculopathy, lumbar region: Secondary | ICD-10-CM

## 2022-05-06 MED ORDER — TIZANIDINE HCL 2 MG PO TABS
2 MG | ORAL_TABLET | Freq: Two times a day (BID) | ORAL | 0 refills | Status: AC | PRN
Start: 2022-05-06 — End: 2022-05-21

## 2022-05-06 NOTE — Progress Notes (Signed)
[facility]  [address]  [place], [place] [address]  Phone: [phone]  Fax: [phone]      Patient: Justin Simmons                                                                              MRN: [id number]        Date of Birth: 1966/03/05          AGE: 56 y.o.             PCP: Raeford Razor, NP-C  Date:  05/06/22    Reason for Consultation: Lower Back Pain      HPI:  Justin Simmons is a 56 y.o. male with relevant PMH of HTN who presents with low back pain with intermittent radiation down the right leg.  The pain began 10 years prior. Denies any precipitating incident or trauma.   Since his last visit he has started PT      Neurologic symptoms: + numbness, tingling. No weakness, bowel or bladder changes.  No recent falls      Location: The pain is located in the low back pain  80%  Radiation: The pain does radiate down the right leg anteriorly.    Pain Score: Currently: 3/10   Quality: Pain is of a aching, throbbing quality.    Aggravating: Pain is exacerbated by walking, sitting, standing, lying down, and exercise  Alleviating: The pain is alleviated by nothing    Prior Treatments:  Physical therapy: Yes- has had one sessions  Injections:ESI many years ago   Surgery:No  Previous Medications: hydrocodone, lyrica, gabapentin- no relief   Current Medications: flexeril 10mg  prn   Previous work-up has included:   X-ray lumbar spine 03/25/22  L4/5 anterolisthesis with disc space narrowing, l4 anterior osteophytes   Past Medical History:   Past Medical History:   Diagnosis Date    Asthma     Hypertension       Past Surgical History:   Past Surgical History:   Procedure Laterality Date    HERNIA REPAIR        SocHx:   Social History     Tobacco Use    Smoking status: Every Day     Packs/day: 0.50     Types: Cigarettes    Smokeless tobacco: Never   Substance Use Topics    Alcohol use: Yes     Alcohol/week: 1.0 - 2.0 standard drink     Comment: Occ      FamHx:?  No family history on file.    Current Medications:   Current Outpatient Medications   Medication Sig Dispense Refill    albuterol sulfate HFA (PROVENTIL HFA) 108 (90 Base) MCG/ACT inhaler Inhale 2 puffs into the lungs every 6 hours as needed for Wheezing 18 g 3    albuterol (PROVENTIL) (2.5 MG/3ML) 0.083% nebulizer solution Inhale into the lungs      amLODIPine (NORVASC) 5 MG tablet Take 1 tablet by mouth daily      hydroCHLOROthiazide (HYDRODIURIL) 25 MG tablet Take 1 tablet by mouth daily       No current facility-administered medications for this visit.  Allergies:  No Known Allergies          Physical Exam     Vital Signs: Ht 5\' 8"  (1.727 m)   Wt 230 lb (104.3 kg)   BMI 34.97 kg/m    General: ???????  male Body mass index is 34.97 kg/m. without any acute distress   Psychiatric: ?  Alert and oriented x 3 with normal mood    HEENT: ????????  Atraumatic   Respiratory:   Breathing non-labored and non dyspneic   CV: ???????????????? Peripheral pulses intact, no peripheral edema   Skin: ?????????????  No rashes       Neurologic: ??      Sensation: normal and grossly intact thebilateral, lower extremity(s)    Strength: 5/5 in the bilateral, lower extremity(s) except weakness right DF 4/5, EHL 4/5, plantar flexion 4/5    Reflexes: reveals 2+ symmetric DTRs throughout except absent bilateral achilles   Gait: normal     Musculoskeletal: Lumbar Exam     Inspection:   Alignment: Normal  Atrophy: None       Tenderness to Palpation:   Lumbar paraspinals Positive  Lumbar spinous processes Negative  SI Joint:  Negative  Gluteal:Positive   Greater trochanter: Negative      ROM:   Lumbar ROM: Abnormal pain worse with flexion  Lumbar facet loading: Negative  Hip ROM: No reproduction of pain with movement     Special Tests      Slump test: Positive right  SLR: Positive right   FABER: Negative  FADIR: Negative  Log Roll: Negative         Assessment:   Right lumbar radiculopathy with weakness right foot    Plan:       -Physical therapy -  continue PT   -Medications - will try tizanidine 2mg  in place of flexeril prn for spasm. Counseled regarding side effects and appropriate administration of medications.    -Diagnostics/Imaging - consider MRI lumbar spine  -Injections - Consider ESI   -Lifestyle - Recommend weight loss regular exercise   -Education - The patient's diagnosis, prognosis and treatment options were discussed today. All questions were answered.    F/U - in 8 week(s) or sooner if needed. If no improvement MRI lumbar spine        Anna-Christina MD  [facility]

## 2022-05-20 IMAGING — CT CT ABD-PELV W/ CM
2 of 5 series · 15 of 46 positions shown, 17 images · IV contrast (OMNIPAQUE 350)
Comparison: CT the abdomen and pelvis 11/27/2013.

CLINICAL DATA: 54-year-old male with history of right upper
quadrant abdominal pain for the past several hours.

EXAM:
CT ABDOMEN AND PELVIS WITH CONTRAST
TECHNIQUE: Multidetector CT imaging of the abdomen and pelvis was performed
using the standard protocol following bolus administration of
intravenous contrast.
CONTRAST:  80mL OMNIPAQUE IOHEXOL 350 MG/ML SOLN

[Series 2: axial st · axial · 0.68mm/px · z∈[-454,-64]mm · 12 of 90 slices shown, 14 images]
[im 6/90  soft-tissue]
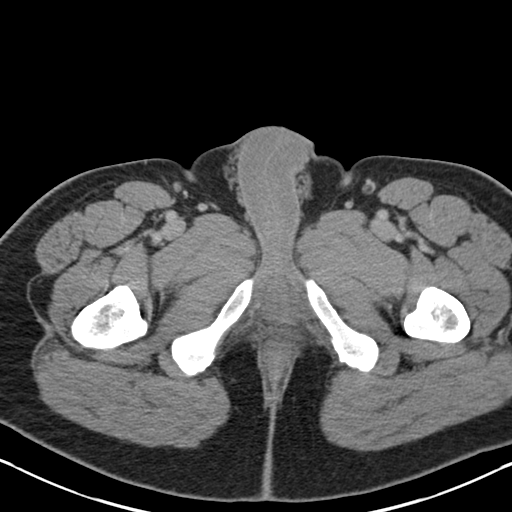
[im 6/90  bone]
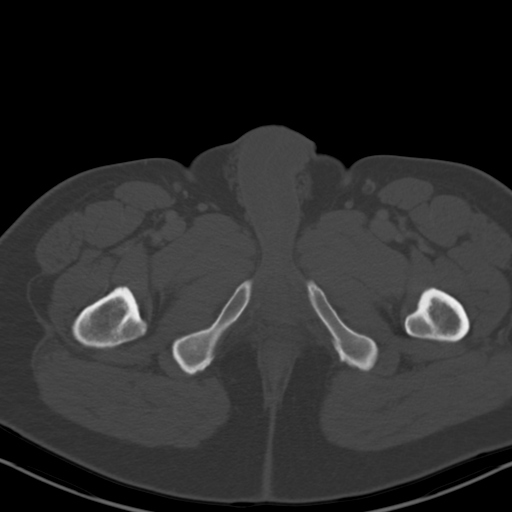
[im 16/90  soft-tissue]
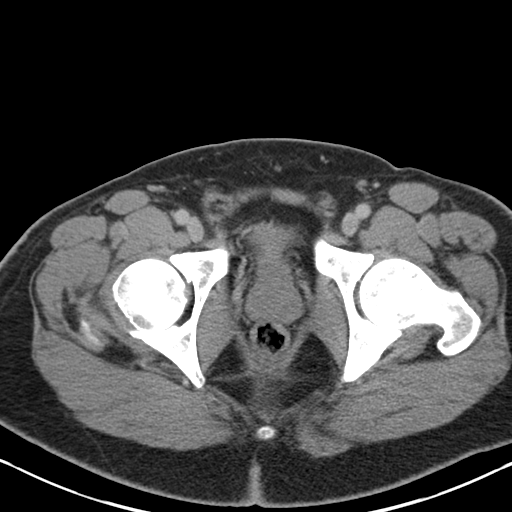
[im 21/90  soft-tissue]
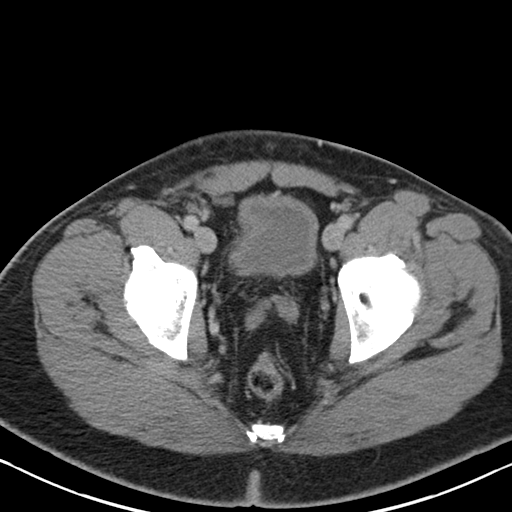
[im 27/90  soft-tissue]
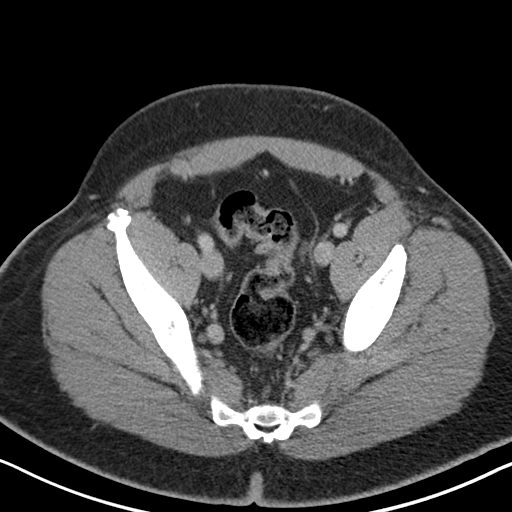
[im 37/90  soft-tissue]
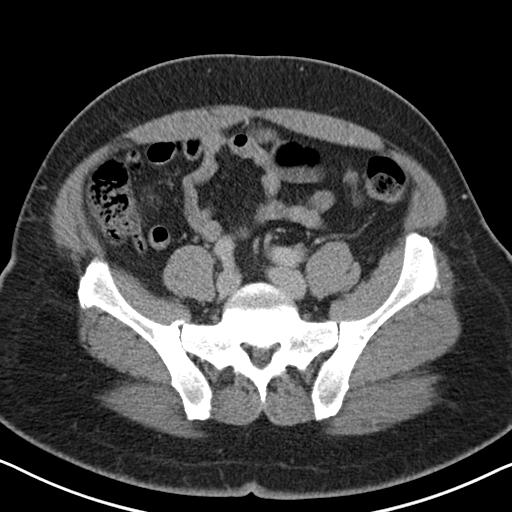
[im 42/90  soft-tissue]
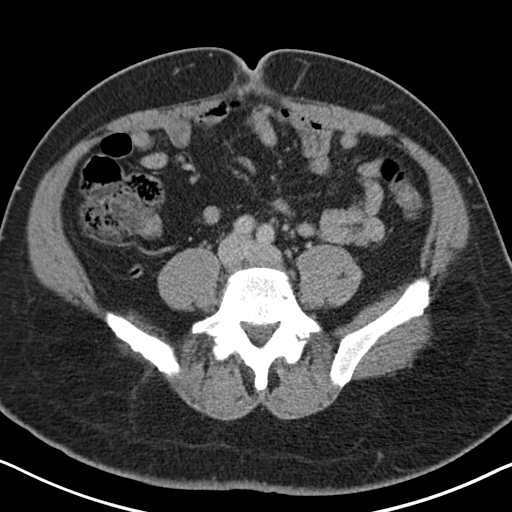
[im 48/90  soft-tissue]
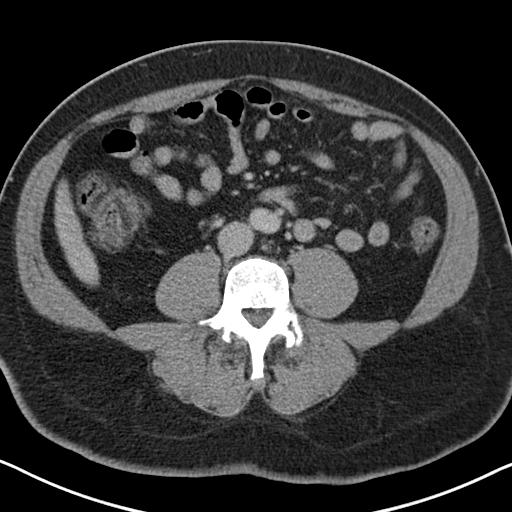
[im 58/90  soft-tissue]
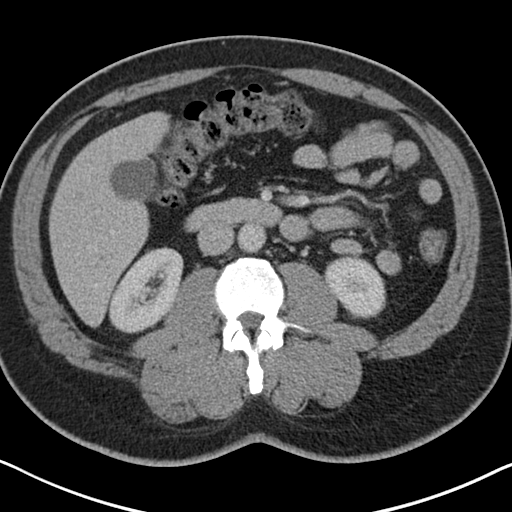
[im 63/90  soft-tissue]
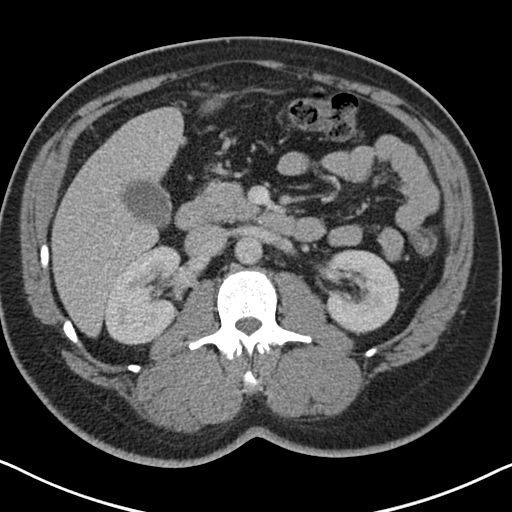
[im 63/90  bone]
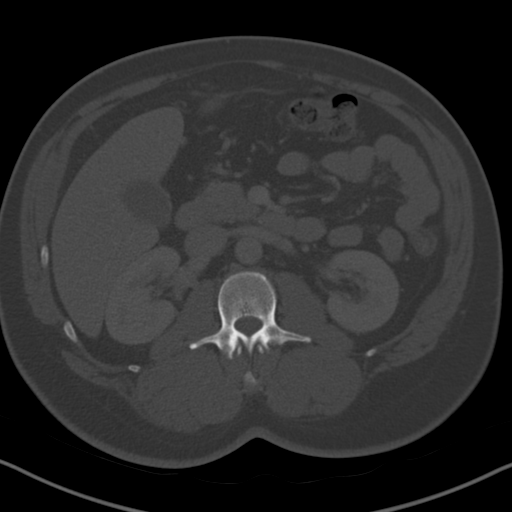
[im 69/90  soft-tissue]
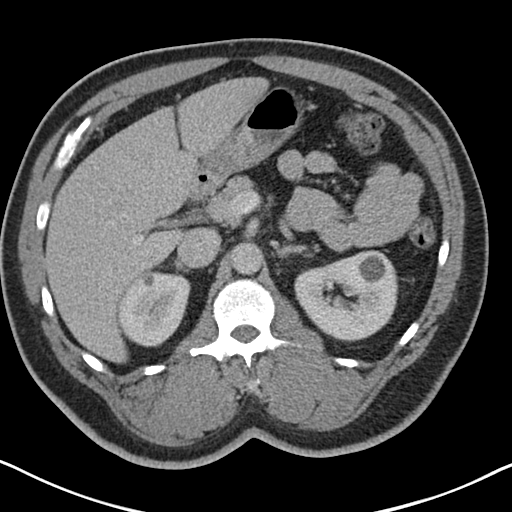
[im 79/90  soft-tissue]
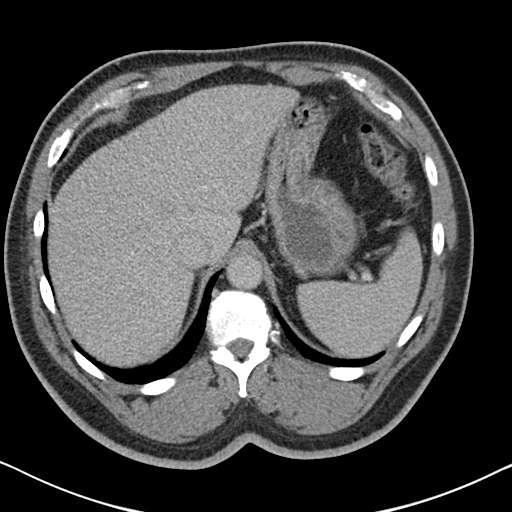
[im 84/90  soft-tissue]
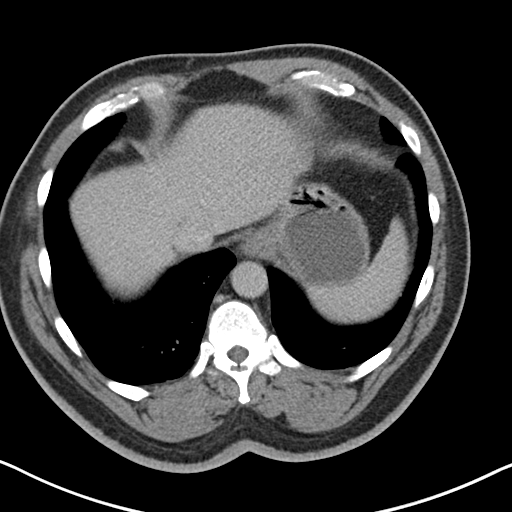

[Series 4: coronal st · coronal · 0.69mm/px · 3 of 151 slices shown]
[im 51/151  soft-tissue]
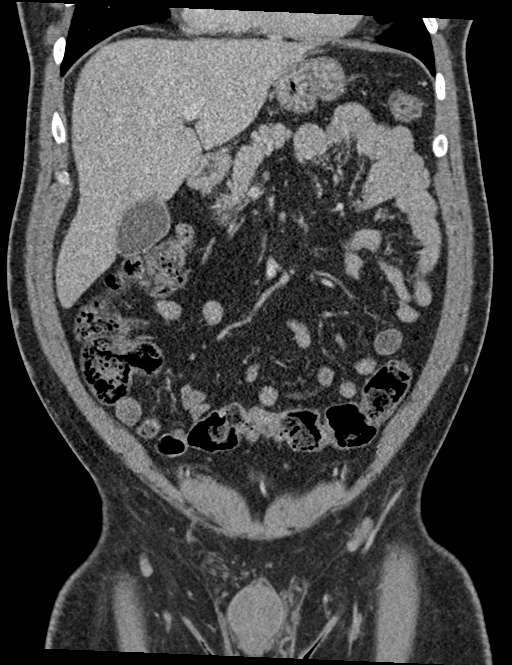
[im 67/151  soft-tissue]
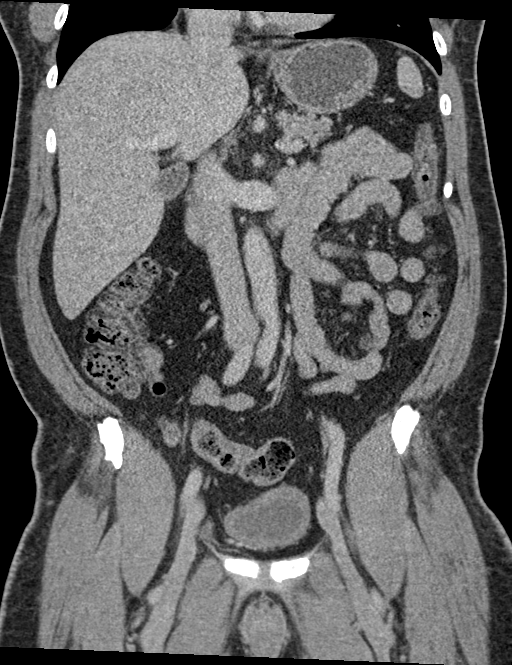
[im 84/151  soft-tissue]
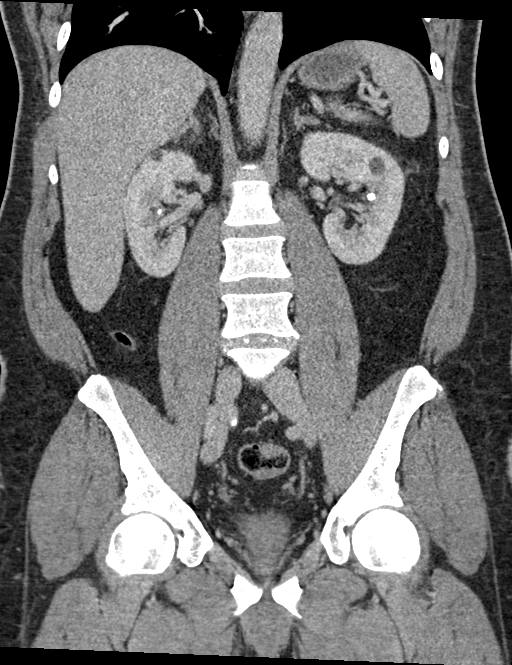

[15 of 46 positions shown; findings below may reference images not displayed]

FINDINGS: Lower chest: Unremarkable.

Hepatobiliary: No suspicious cystic or solid hepatic lesions. No
intra or extrahepatic biliary ductal dilatation. Gallbladder is
normal in appearance.

Pancreas: No pancreatic mass. No pancreatic ductal dilatation. No
pancreatic or peripancreatic fluid collections or inflammatory
changes.

Spleen: Unremarkable.

Adrenals/Urinary Tract: In the interpolar collecting system of the
left kidney (axial image 26 of series 2) there is a 5 mm
nonobstructive calculus. 3 mm nonobstructive calculus also noted in
the lower pole collecting system of the right kidney. No additional
calculi are noted along the course of either ureter or within the
lumen of the urinary bladder. No hydroureteronephrosis. Well-defined
low-attenuation nonenhancing lesions in both kidneys measuring up to
2 cm in the upper pole of the left kidney, compatible with simple
cysts. No aggressive renal lesions. No hydro ureteral stretch that
bilateral adrenal glands are normal in appearance. Urinary bladder
is nearly completely decompressed, but otherwise unremarkable in
appearance.

Stomach/Bowel: The appearance of the stomach is normal. There is no
pathologic dilatation of small bowel or colon. Normal appendix.

Vascular/Lymphatic: Aortic atherosclerosis, without evidence of
aneurysm or dissection in the abdominal or pelvic vasculature. No
lymphadenopathy noted in the abdomen or pelvis.

Reproductive: Prostate gland and seminal vesicles are unremarkable
in appearance.

Other: No significant volume of ascites.  No pneumoperitoneum.

Musculoskeletal: Area of mixed lucency and sclerosis in the left
femoral head/neck, indeterminate, but stable compared to prior
examination 11/27/2013, presumably benign. There are no other
aggressive appearing lytic or blastic lesions noted in the
visualized portions of the skeleton.
IMPRESSION: 1. No acute findings are noted in the abdomen or pelvis to account
for the patient's symptoms.
2. Nonobstructive calculi are noted within the collecting systems of
both kidneys measuring up to 5 mm in the interpolar collecting
system of left kidney. No ureteral stones or findings of urinary
tract obstruction are noted at this time.
3. Additional incidental findings, as above.

## 2022-05-21 DIAGNOSIS — J4520 Mild intermittent asthma, uncomplicated: Secondary | ICD-10-CM

## 2022-05-22 ENCOUNTER — Encounter: Payer: PRIVATE HEALTH INSURANCE | Attending: Family | Primary: Family

## 2022-05-22 DIAGNOSIS — J4520 Mild intermittent asthma, uncomplicated: Secondary | ICD-10-CM

## 2022-05-22 NOTE — Progress Notes (Unsigned)
No chief complaint on file.    Assessment & Plan:     1. Mild intermittent asthma without complication  2. Uncontrolled hypertension  3. Screen for colon cancer  Comments:  Referred to [facility] in January, advised to schedule  4. Need for hepatitis C screening test  5. Prostate cancer screening    4 weeks for physical, lab results  Subjective:     HPI    Asthma-  Compliant with regimen  Daytime symptoms per week:  Nighttime awakenings per week:  Use of inhaler in a week:    Interference with normal activity: (None, minor, some, extreme)  Treatment:  Albuterol PRN, nebulizer  Was seen at [facility] in January and started on abx at the time  Did not follow up in February as advised    Hypertension-  Symptoms:  ***  BP readings at home are ***  Comorbid: Obesity  Current treatment: amlodipine 5 mg, HCTZ 25 mg  Medications restarted in January  Did not complete fasting labs as ordered    Health Maintenance:  COVID-19 vac -  received 2 doses  Tetanus vac - thinks he has had this in [place]  Pneumonia vaccine-   Shingles vaccine- recommended   Colonoscopy- referred in January  PSA - reordered  Hep C screening - reordered  HIV screening -    Review of Systems   Constitutional: Negative.    Respiratory:  Negative for shortness of breath.    Cardiovascular:  Negative for chest pain, palpitations and leg swelling.   Neurological:  Negative for dizziness and headaches.     Objective:   There were no vitals taken for this visit.    Physical Exam  Vitals and nursing note reviewed.   Constitutional:       General: He is not in acute distress.     Appearance: He is not ill-appearing.   HENT:      Head: Normocephalic and atraumatic.   Cardiovascular:      Rate and Rhythm: Normal rate and regular rhythm.   Pulmonary:      Effort: Pulmonary effort is normal. No respiratory distress.      Breath sounds: No wheezing, rhonchi or rales.   Skin:     General: Skin is warm and dry.   Neurological:      Mental Status: He is alert. Mental status is at  baseline.   Psychiatric:         Mood and Affect: Mood normal.         Thought Content: Thought content normal.         Judgment: Judgment normal.       Raeford Razor, FNP-C

## 2022-06-17 ENCOUNTER — Encounter: Payer: PRIVATE HEALTH INSURANCE | Attending: Physical Medicine & Rehabilitation | Primary: Family

## 2022-07-24 ENCOUNTER — Encounter: Payer: PRIVATE HEALTH INSURANCE | Attending: Physical Medicine & Rehabilitation | Primary: Family

## 2023-02-05 NOTE — ED Notes (Signed)
Formatting of this note might be different from the original.  Patient hypertensive. Last BP reading 211/120 mm Hg. Dr. Alfonse Spruce made aware. No new orders at this time. Pt states that he currently does not take anything for him BP at home, he is just in pain right now. Pt medicated with pain medication per MAR.   Electronically signed by Abran Richard, RN at 02/05/2023  9:42 AM EST

## 2023-02-05 NOTE — ED Notes (Signed)
Formatting of this note might be different from the original.  Pt c/o stomach pain to upper middle region. Pt also states he's been feeling nauseous. Pain started two days ago. Pt is currently not in any respiratory distress at this time. Pt has a hx of pancreatitis and think that is what it is.   Electronically signed by Abran Richard, RN at 02/05/2023  8:54 AM EST

## 2023-02-05 NOTE — ED Triage Notes (Signed)
Formatting of this note might be different from the original.  Pt c/o upper mid abd pain x2 days. Reports hx of pancreatitis  Electronically signed by Janece Canterbury, RN at 02/05/2023  8:40 AM EST

## 2023-02-05 NOTE — ED Notes (Signed)
Formatting of this note might be different from the original.  Pain assessment on discharge was improved.  Condition stable.  Patient discharged to home.  Patient education was completed:  yes  Education taught to:  patient  Teaching method used was discussion and handout.  Understanding of teaching was fair.  Patient was discharged ambulatory.  Discharged with self.  Valuables were given to: patient.   Electronically signed by Abran Richard, RN at 02/05/2023 12:08 PM EST

## 2023-02-10 NOTE — ED Triage Notes (Signed)
Formatting of this note might be different from the original.  Pt here with c/o cough, fever, and chills x 1 day.    Electronically signed by Brien Few, RN at 02/10/2023  4:40 AM EST

## 2023-02-10 NOTE — ED Notes (Signed)
Formatting of this note might be different from the original.  Discharged by PA  Electronically signed by Gus Height, RN at 02/10/2023 11:43 AM EST
